# Patient Record
Sex: Female | Born: 1960 | Marital: Married | Smoking: Former smoker
Health system: Southern US, Community
[De-identification: ages and names within clinical notes are randomized; demographics above are authoritative.]

## PROBLEM LIST (undated history)

## (undated) DIAGNOSIS — E7800 Pure hypercholesterolemia, unspecified: Secondary | ICD-10-CM

## (undated) DIAGNOSIS — M069 Rheumatoid arthritis, unspecified: Secondary | ICD-10-CM

## (undated) HISTORY — PX: ABDOMINAL HYSTERECTOMY: SHX81

## (undated) HISTORY — DX: Rheumatoid arthritis, unspecified: M06.9

## (undated) HISTORY — DX: Pure hypercholesterolemia, unspecified: E78.00

## (undated) HISTORY — PX: CHOLECYSTECTOMY: SHX55

---

## 2013-05-21 HISTORY — PX: COLONOSCOPY: SHX174

## 2015-02-13 ENCOUNTER — Ambulatory Visit (INDEPENDENT_AMBULATORY_CARE_PROVIDER_SITE_OTHER): Payer: Private Health Insurance - Indemnity

## 2015-02-13 ENCOUNTER — Encounter: Payer: Self-pay | Admitting: Podiatry

## 2015-02-13 ENCOUNTER — Ambulatory Visit (INDEPENDENT_AMBULATORY_CARE_PROVIDER_SITE_OTHER): Payer: Private Health Insurance - Indemnity | Admitting: Podiatry

## 2015-02-13 DIAGNOSIS — M722 Plantar fascial fibromatosis: Secondary | ICD-10-CM

## 2015-02-13 MED ORDER — METHYLPREDNISOLONE 4 MG PO TBPK: ORAL_TABLET | ORAL | Status: DC

## 2015-02-13 MED ORDER — MELOXICAM 15 MG PO TABS: 15.0000 mg | ORAL_TABLET | Freq: Every day | ORAL | Status: DC

## 2015-02-13 NOTE — Progress Notes (Signed)
   Subjective:    Patient ID: Danielle Curtis, female    DOB: Jul 09, 1961, 54 y.o.   MRN: [id number]  HPI  PT STATED B/L BOTTOM OF THE FOOT BEEN HAVING PAIN FOR 1 YEAR. FEET ARE GETTING WORSE ESPECIALLY WHEN SITTING THEN START WALKING IT GET WORSE. TRIED INSERTS BUT NO HELP.  Review of Systems  Musculoskeletal: Positive for myalgias and joint swelling.  All other systems reviewed and are negative.      Objective:   Physical Exam: I have reviewed her past medical history medications allergies surgery social history and review of systems. Pulses are strongly palpable bilateral. Neurologic sensorium is intact per Semmes-Weinstein monofilament and deep tendon reflexes are intact bilaterally and brisk. Muscle strength +5 over 5 dorsiflexors plantar flexors and inverters and everters all intrinsic musculature of the foot appears to be intact. Orthopedic evaluation demonstrates all joints distal to the ankle level fall range of motion without crepitus she does have pain on palpation of the medial calcaneal tubercle bilateral. No pain on palpation of the medial and lateral calcaneus. Cutaneous evaluation demonstrates supple well-hydrated cutis no erythema edema cellulitis drainage or odor.        Assessment & Plan:  Assessment: Plantar fasciitis bilateral.  Plan: Discussed etiology pathology conservative versus surgical therapies. Discussed appropriate shoe gear stretching exercises ice therapy and shoe gear modifications. Injected the bilateral heels today with Kenalog and local and aesthetic. Started her on a Medrol Dosepak to be followed by meloxicam. Placed her in plantar fascial braces and a single night splint. I will follow up with her 1 month.

## 2015-02-13 NOTE — Patient Instructions (Signed)
Plantar Fasciitis (Heel Spur Syndrome) with Rehab The plantar fascia is a fibrous, ligament-like, soft-tissue structure that spans the bottom of the foot. Plantar fasciitis is a condition that causes pain in the foot due to inflammation of the tissue. SYMPTOMS   Pain and tenderness on the underneath side of the foot.  Pain that worsens with standing or walking. CAUSES  Plantar fasciitis is caused by irritation and injury to the plantar fascia on the underneath side of the foot. Common mechanisms of injury include:  Direct trauma to bottom of the foot.  Damage to a small nerve that runs under the foot where the main fascia attaches to the heel bone.  Stress placed on the plantar fascia due to bone spurs. RISK INCREASES WITH:   Activities that place stress on the plantar fascia (running, jumping, pivoting, or cutting).  Poor strength and flexibility.  Improperly fitted shoes.  Tight calf muscles.  Flat feet.  Failure to warm-up properly before activity.  Obesity. PREVENTION  Warm up and stretch properly before activity.  Allow for adequate recovery between workouts.  Maintain physical fitness:  Strength, flexibility, and endurance.  Cardiovascular fitness.  Maintain a health body weight.  Avoid stress on the plantar fascia.  Wear properly fitted shoes, including arch supports for individuals who have flat feet. PROGNOSIS  If treated properly, then the symptoms of plantar fasciitis usually resolve without surgery. However, occasionally surgery is necessary. RELATED COMPLICATIONS   Recurrent symptoms that may result in a chronic condition.  Problems of the lower back that are caused by compensating for the injury, such as limping.  Pain or weakness of the foot during push-off following surgery.  Chronic inflammation, scarring, and partial or complete fascia tear, occurring more often from repeated injections. TREATMENT  Treatment initially involves the use of  ice and medication to help reduce pain and inflammation. The use of strengthening and stretching exercises may help reduce pain with activity, especially stretches of the Achilles tendon. These exercises may be performed at home or with a therapist. Your caregiver may recommend that you use heel cups of arch supports to help reduce stress on the plantar fascia. Occasionally, corticosteroid injections are given to reduce inflammation. If symptoms persist for greater than 6 months despite non-surgical (conservative), then surgery may be recommended.  MEDICATION   If pain medication is necessary, then nonsteroidal anti-inflammatory medications, such as aspirin and ibuprofen, or other minor pain relievers, such as acetaminophen, are often recommended.  Do not take pain medication within 7 days before surgery.  Prescription pain relievers may be given if deemed necessary by your caregiver. Use only as directed and only as much as you need.  Corticosteroid injections may be given by your caregiver. These injections should be reserved for the most serious cases, because they may only be given a certain number of times. HEAT AND COLD  Cold treatment (icing) relieves pain and reduces inflammation. Cold treatment should be applied for 10 to 15 minutes every 2 to 3 hours for inflammation and pain and immediately after any activity that aggravates your symptoms. Use ice packs or massage the area with a piece of ice (ice massage).  Heat treatment may be used prior to performing the stretching and strengthening activities prescribed by your caregiver, physical therapist, or athletic trainer. Use a heat pack or soak the injury in warm water. SEEK IMMEDIATE MEDICAL CARE IF:  Treatment seems to offer no benefit, or the condition worsens.  Any medications produce adverse side effects. EXERCISES RANGE   OF MOTION (ROM) AND STRETCHING EXERCISES - Plantar Fasciitis (Heel Spur Syndrome) These exercises may help you  when beginning to rehabilitate your injury. Your symptoms may resolve with or without further involvement from your physician, physical therapist or athletic trainer. While completing these exercises, remember:   Restoring tissue flexibility helps normal motion to return to the joints. This allows healthier, less painful movement and activity.  An effective stretch should be held for at least 30 seconds.  A stretch should never be painful. You should only feel a gentle lengthening or release in the stretched tissue. RANGE OF MOTION - Toe Extension, Flexion  Sit with your right / left leg crossed over your opposite knee.  Grasp your toes and gently pull them back toward the top of your foot. You should feel a stretch on the bottom of your toes and/or foot.  Hold this stretch for __________ seconds.  Now, gently pull your toes toward the bottom of your foot. You should feel a stretch on the top of your toes and or foot.  Hold this stretch for __________ seconds. Repeat __________ times. Complete this stretch __________ times per day.  RANGE OF MOTION - Ankle Dorsiflexion, Active Assisted  Remove shoes and sit on a chair that is preferably not on a carpeted surface.  Place right / left foot under knee. Extend your opposite leg for support.  Keeping your heel down, slide your right / left foot back toward the chair until you feel a stretch at your ankle or calf. If you do not feel a stretch, slide your bottom forward to the edge of the chair, while still keeping your heel down.  Hold this stretch for __________ seconds. Repeat __________ times. Complete this stretch __________ times per day.  STRETCH - Gastroc, Standing  Place hands on wall.  Extend right / left leg, keeping the front knee somewhat bent.  Slightly point your toes inward on your back foot.  Keeping your right / left heel on the floor and your knee straight, shift your weight toward the wall, not allowing your back to  arch.  You should feel a gentle stretch in the right / left calf. Hold this position for __________ seconds. Repeat __________ times. Complete this stretch __________ times per day. STRETCH - Soleus, Standing  Place hands on wall.  Extend right / left leg, keeping the other knee somewhat bent.  Slightly point your toes inward on your back foot.  Keep your right / left heel on the floor, bend your back knee, and slightly shift your weight over the back leg so that you feel a gentle stretch deep in your back calf.  Hold this position for __________ seconds. Repeat __________ times. Complete this stretch __________ times per day. STRETCH - Gastrocsoleus, Standing  Note: This exercise can place a lot of stress on your foot and ankle. Please complete this exercise only if specifically instructed by your caregiver.   Place the ball of your right / left foot on a step, keeping your other foot firmly on the same step.  Hold on to the wall or a rail for balance.  Slowly lift your other foot, allowing your body weight to press your heel down over the edge of the step.  You should feel a stretch in your right / left calf.  Hold this position for __________ seconds.  Repeat this exercise with a slight bend in your right / left knee. Repeat __________ times. Complete this stretch __________ times per day.    STRENGTHENING EXERCISES - Plantar Fasciitis (Heel Spur Syndrome)  These exercises may help you when beginning to rehabilitate your injury. They may resolve your symptoms with or without further involvement from your physician, physical therapist or athletic trainer. While completing these exercises, remember:   Muscles can gain both the endurance and the strength needed for everyday activities through controlled exercises.  Complete these exercises as instructed by your physician, physical therapist or athletic trainer. Progress the resistance and repetitions only as guided. STRENGTH -  Towel Curls  Sit in a chair positioned on a non-carpeted surface.  Place your foot on a towel, keeping your heel on the floor.  Pull the towel toward your heel by only curling your toes. Keep your heel on the floor.  If instructed by your physician, physical therapist or athletic trainer, add ____________________ at the end of the towel. Repeat __________ times. Complete this exercise __________ times per day. STRENGTH - Ankle Inversion  Secure one end of a rubber exercise band/tubing to a fixed object (table, pole). Loop the other end around your foot just before your toes.  Place your fists between your knees. This will focus your strengthening at your ankle.  Slowly, pull your big toe up and in, making sure the band/tubing is positioned to resist the entire motion.  Hold this position for __________ seconds.  Have your muscles resist the band/tubing as it slowly pulls your foot back to the starting position. Repeat __________ times. Complete this exercises __________ times per day.  Document Released: 08/02/2005 Document Revised: 10/25/2011 Document Reviewed: 11/14/2008 Exit[employer] Patient Information 2015 [employer]. This information is not intended to replace advice given to you by your health care provider. Make sure you discuss any questions you have with your health care provider.  

## 2015-03-06 ENCOUNTER — Ambulatory Visit (INDEPENDENT_AMBULATORY_CARE_PROVIDER_SITE_OTHER): Payer: Managed Care, Other (non HMO) | Admitting: Podiatry

## 2015-03-06 ENCOUNTER — Encounter: Payer: Self-pay | Admitting: Podiatry

## 2015-03-06 VITALS — BP 119/79 | HR 94 | Resp 16

## 2015-03-06 DIAGNOSIS — M722 Plantar fascial fibromatosis: Secondary | ICD-10-CM | POA: Diagnosis not present

## 2015-03-06 DIAGNOSIS — L603 Nail dystrophy: Secondary | ICD-10-CM

## 2015-03-06 NOTE — Progress Notes (Signed)
She presents today for follow-up of her plantar fasciitis. She is also complaining of thick possibly dystrophic nails of the hallux bilaterally which are painful with shoe gear. She states that she has not had another pain in her heels since she left last visit. She states that she continues all of her conservative therapies. She is also complaining of a numb sensation to her hallux right.  Objective: Vital signs are stable she is alert and oriented 3 pulses are palpable bilateral. No pain on palpation bilateral plantar calcaneal tubercles. She does have thick dystrophic possibly mycotic nails to the hallux bilaterally left greater than right there is subungual debris and nail plate is friable and brittle. Possible tinea pedis bilateral. No obvious finding for the numbness in her hallux right. Plantar distal aspect.   Assessment: Well-healing plantar fasciitis. Possible nail dystrophy hallux bilateral rule out onychomycosis. Neuropraxia hallux right.  Plan: Samples of the nails from the left hallux today. I suggested that she continue all conservative therapies for one more month regarding her plantar fasciitis. This is consisting of the use of the night splint tennis shoes plantar fascial braces and anti-inflammatory's. I'll follow-up with her in 1 month or when her nail biopsies return.

## 2015-03-13 ENCOUNTER — Ambulatory Visit: Payer: Private Health Insurance - Indemnity | Admitting: Podiatry

## 2015-03-25 ENCOUNTER — Telehealth: Payer: Self-pay | Admitting: *Deleted

## 2015-03-25 NOTE — Telephone Encounter (Signed)
Left message instructing pt to make an appt to discuss the lab results with Dr. Milinda Pointer.  Toenail culture +for fungus.

## 2015-03-27 ENCOUNTER — Ambulatory Visit: Payer: Private Health Insurance - Indemnity | Admitting: Podiatry

## 2015-04-01 ENCOUNTER — Encounter: Payer: Self-pay | Admitting: Podiatry

## 2015-04-01 ENCOUNTER — Ambulatory Visit (INDEPENDENT_AMBULATORY_CARE_PROVIDER_SITE_OTHER): Payer: Managed Care, Other (non HMO) | Admitting: Podiatry

## 2015-04-01 VITALS — BP 123/74 | HR 80 | Resp 16

## 2015-04-01 DIAGNOSIS — L603 Nail dystrophy: Secondary | ICD-10-CM

## 2015-04-01 MED ORDER — ITRACONAZOLE 200 MG PO TABS: 1.0000 | ORAL_TABLET | Freq: Every day | ORAL | Status: DC

## 2015-04-01 MED ORDER — MELOXICAM 15 MG PO TABS: 15.0000 mg | ORAL_TABLET | Freq: Every day | ORAL | Status: DC

## 2015-04-01 NOTE — Progress Notes (Signed)
She presents today for follow-up of her onychomycosis. She states that this has not changed. No changes in her past medical history medications allergies.  Objective: 54 year old white female strong palpable pulses also stable alert and oriented 3. Pulses are strongly palpable neurologic sensorium is intact percent as was the monofilament. Deep tendon reflexes are intact bilateral muscle strength is 5 over 5 dorsiflexion plantar flexors and inverters everters all intrinsic musculature is intact. Orthopedic evaluation and x-rays all joints distal to the angle full range of motion without crepitation. Cutaneous evaluationsupple well-hydrated cutis nails are thick yellow dystrophic onychomycotic. Pathology report was positive for onychomycosis separate fungus.  Assessment: Saprophyticus onychomycosis.  Plan: At this point we discussed pros and cons of conservative versus surgical therapies she would also like to consider oral therapy. We started her on ONMEL and we requested a liver profile. Should this come back abnormal I will notify her immediately. I will follow-up with her in 1 month for another blood work. Should this one come back abnormal we will notify her immediately.

## 2015-04-01 NOTE — Patient Instructions (Signed)
Itraconazole capsules and tablets What is this medicine? ITRACONAZOLE (i tra KO na zole) is an antifungal medicine. It is used to treat certain kinds of fungal or yeast infections. This medicine may be used for other purposes; ask your health care provider or pharmacist if you have questions. COMMON BRAND NAME(S): ONMEL, Sporanox What should I tell my health care provider before I take this medicine? They need to know if you have any of these conditions: -heart disease, including angina or heart failure -history of irregular heartbeat -kidney disease or on dialysis -liver disease -lung disease -an unusual or allergic reaction to itraconazole, or other antifungal medicines, foods, dyes or preservatives -pregnant or trying to get pregnant -breast-feeding How should I use this medicine? Take this medicine by mouth with a full glass of water. Follow the directions on the prescription label. Take after eating a full meal. If you have a condition called achlorhydria, are taking H2-receptor antagonists or other gastric acid suppressors, you should take this medicine with a cola beverage. Take your doses at regular intervals. Do not take your medicine more often than directed. Do not stop taking except on your doctor's advice. Talk to your pediatrician regarding the use of this medicine in children. Special care may be needed. Overdosage: If you think you have taken too much of this medicine contact a poison control center or emergency room at once. NOTE: This medicine is only for you. Do not share this medicine with others. What if I miss a dose? If you miss a dose, take it as soon as you can. If it is almost time for your next dose, take only that dose. Do not take double or extra doses. What may interact with this medicine? Do not take this medicine with any of the following medications: -alfuzosin -cisapride -colchicine -ergot alkaloids like dihydroergotamine, ergonovine, ergotamine,  methylergonovine -methadone -nevirapine -pimozide -red yeast rice -sirolimus -some medicines for anxiety or sleep like alprazolam, clorazepate, flurazepam, midazolam, triazolam -some medicines for blood pressure like felodipine and nisoldipine -some medicines for cancer treatment like irinotecan -some medicines for cholesterol like atorvastatin, cerivastatin, lovastatin, simvastatin -some medicines for the heart like conivaptan, disopyramide, dofetilide, dronedarone, eplerenone, quinidine, ranolazine -some medicines for psychotic disturbances like lurasidone This medicine may also interact with the following medications: -alfentanil -antacids -antiviral medicines for HIV or AIDS -budesonide -buspirone -cilostazol -cyclosporine -digoxin -eletriptan -erythromycin -fentanyl -fluticasone -halofantrine -medicines for blood pressure like amlodipine and nifedipine -medicines for stomach problems like cimetidine, famotidine, omeprazole, lansoprazole -medicines for tuberculosis like isoniazid, INH, rifabutin, rifampin, rifapentine -medicines that treat or prevent blood clots like warfarin -methylprednisolone -other medicines for fungal infections -phenytoin, fosphenytoin -some medicines for diabetes -tacrolimus -trimetrexate This list may not describe all possible interactions. Give your health care provider a list of all the medicines, herbs, non-prescription drugs, or dietary supplements you use. Also tell them if you smoke, drink alcohol, or use illegal drugs. Some items may interact with your medicine. What should I watch for while using this medicine? Visit your doctor or health care professional for check ups. Tell your doctor or healthcare professional if your symptoms do not start to get better or if they get worse. Some fungal infections can take many weeks or months of treatment to cure. Avoid medicines for your stomach like antacids, anticholinergics, and acid blockers for at  least 2 hours after taking this medicine. You may get dizzy or blurred/double vision when taking this medicine. Do not drive, use machinery, or do anything that may be dangerous  until you know how the medicine affects you. Do not stand or sit up quickly, especially if you are an older patient. What side effects may I notice from receiving this medicine? Side effects that you should report to your doctor or health care professional as soon as possible: -allergic reactions like skin rash, itching or hives, swelling of the face, lips, or tongue -breathing problems -changes in hearing -cough up mucus -dark urine -fast, irregular heartbeat -general ill feeling or flu-like symptoms -light-colored stools -loss of appetite -nausea, vomiting -pain, tingling, numbness in the hands or feet -redness, blistering, peeling or loosening of the skin, including inside the mouth -right upper belly pain -sudden weight gain -swelling in feet, ankles, or legs -unusually weak or tired -yellowing of the eyes or skin Side effects that usually do not require medical attention (report to your doctor or health care professional if they continue or are bothersome): -blurred/double vision -diarrhea -dizziness -headache -stomach upset or bloating This list may not describe all possible side effects. Call your doctor for medical advice about side effects. You may report side effects to FDA at 1-800-FDA-1088. Where should I keep my medicine? Keep out of the reach of children. Store at room temperature between 15 and 25 degrees C (59 and 77 degrees F). Protect from light and moisture. Throw away any unused medicine after the expiration date. NOTE: This sheet is a summary. It may not cover all possible information. If you have questions about this medicine, talk to your doctor, pharmacist, or health care provider.  2015, [employer]vier/Gold Standard. (2013-02-12 17:02:46)

## 2015-04-02 LAB — CBC WITH DIFFERENTIAL/PLATELET
BASOS ABS: 0 10*3/uL (ref 0.0–0.1)
Basophils Relative: 0 % (ref 0–1)
EOS ABS: 0.1 10*3/uL (ref 0.0–0.7)
Eosinophils Relative: 1 % (ref 0–5)
HCT: 40.8 % (ref 36.0–46.0)
HEMOGLOBIN: 13.8 g/dL (ref 12.0–15.0)
LYMPHS ABS: 2.6 10*3/uL (ref 0.7–4.0)
LYMPHS PCT: 33 % (ref 12–46)
MCH: 30.1 pg (ref 26.0–34.0)
MCHC: 33.8 g/dL (ref 30.0–36.0)
MCV: 89.1 fL (ref 78.0–100.0)
MONOS PCT: 6 % (ref 3–12)
MPV: 9.3 fL (ref 8.6–12.4)
Monocytes Absolute: 0.5 10*3/uL (ref 0.1–1.0)
NEUTROS ABS: 4.7 10*3/uL (ref 1.7–7.7)
NEUTROS PCT: 60 % (ref 43–77)
PLATELETS: 312 10*3/uL (ref 150–400)
RBC: 4.58 MIL/uL (ref 3.87–5.11)
RDW: 13.9 % (ref 11.5–15.5)
WBC: 7.8 10*3/uL (ref 4.0–10.5)

## 2015-04-02 LAB — HEPATIC FUNCTION PANEL
ALBUMIN: 3.9 g/dL (ref 3.6–5.1)
ALK PHOS: 55 U/L (ref 33–130)
ALT: 17 U/L (ref 6–29)
AST: 16 U/L (ref 10–35)
BILIRUBIN TOTAL: 0.5 mg/dL (ref 0.2–1.2)
Bilirubin, Direct: 0.1 mg/dL (ref <=0.2)
Indirect Bilirubin: 0.4 mg/dL (ref 0.2–1.2)
Total Protein: 7 g/dL (ref 6.1–8.1)

## 2015-04-03 ENCOUNTER — Telehealth: Payer: Self-pay | Admitting: *Deleted

## 2015-04-03 NOTE — Telephone Encounter (Addendum)
-----   Message from Rip Harbour, [facility] sent at 04/03/2015  3:19 PM EDT -----   ----- Message -----    From: Garrel Ridgel, DPM    Sent: 04/03/2015   7:16 AM      To: Avanell Shackleton Prevette, PMAC  Blood work looks good and may continue medication.  I informed pt of Dr. Stephenie Acres recommendations.  Pt asked multiple questions concerning the reason the Onmel was sent to [facility] and I informed her the [employer] worked with the drug company to get the medication approved.  Pt asked if it would be okay to hold off on the Onmel, because she was going on a multiple month diet that she would not be able to take pills on, and I said I would inform Dr. Milinda Pointer.

## 2015-04-11 ENCOUNTER — Telehealth: Payer: Self-pay | Admitting: Podiatry

## 2015-04-11 MED ORDER — ITRACONAZOLE 200 MG PO TABS: 1.0000 | ORAL_TABLET | Freq: Every day | ORAL | Status: DC

## 2015-04-11 NOTE — Telephone Encounter (Signed)
Pt's original order for the Onmel was put in as generic Itraconazole.  Reordered as Onmel and sent to [facility] to be processed, and I requested a confirmation.  I informed the pt.

## 2015-04-11 NOTE — Telephone Encounter (Signed)
Pt called and was seen on 8.17 and has not received it yet

## 2015-04-29 ENCOUNTER — Ambulatory Visit: Payer: Managed Care, Other (non HMO) | Admitting: Podiatry

## 2018-08-30 DIAGNOSIS — Z1231 Encounter for screening mammogram for malignant neoplasm of breast: Secondary | ICD-10-CM | POA: Diagnosis not present

## 2018-09-22 DIAGNOSIS — M2550 Pain in unspecified joint: Secondary | ICD-10-CM | POA: Diagnosis not present

## 2018-09-22 DIAGNOSIS — F321 Major depressive disorder, single episode, moderate: Secondary | ICD-10-CM | POA: Diagnosis not present

## 2018-09-22 DIAGNOSIS — M0579 Rheumatoid arthritis with rheumatoid factor of multiple sites without organ or systems involvement: Secondary | ICD-10-CM | POA: Diagnosis not present

## 2018-09-22 DIAGNOSIS — H209 Unspecified iridocyclitis: Secondary | ICD-10-CM | POA: Diagnosis not present

## 2018-09-26 ENCOUNTER — Telehealth: Payer: Self-pay | Admitting: Gastroenterology

## 2018-09-26 NOTE — Telephone Encounter (Signed)
Pt is wanting to be seen by [facility] last colon 05/2013 [place]. PT records were placed on DOD desk (09/25/2018) Dr.Cirigliano

## 2018-10-05 NOTE — Telephone Encounter (Signed)
Dr.Cirigliano reviewed records and noted "colonoscopy 05/2013 diverticulosis on in sigmoid colon otherwise normal okay to repeat 10 years." Patient has been notified of this information and is scheduled for an office visit to discuss trouble swallowing.

## 2018-10-17 DIAGNOSIS — Z6830 Body mass index (BMI) 30.0-30.9, adult: Secondary | ICD-10-CM | POA: Diagnosis not present

## 2018-10-17 DIAGNOSIS — R05 Cough: Secondary | ICD-10-CM | POA: Diagnosis not present

## 2018-10-17 DIAGNOSIS — J111 Influenza due to unidentified influenza virus with other respiratory manifestations: Secondary | ICD-10-CM | POA: Diagnosis not present

## 2018-10-17 DIAGNOSIS — R5381 Other malaise: Secondary | ICD-10-CM | POA: Diagnosis not present

## 2018-10-19 ENCOUNTER — Encounter: Payer: Self-pay | Admitting: Gastroenterology

## 2018-10-19 ENCOUNTER — Encounter (INDEPENDENT_AMBULATORY_CARE_PROVIDER_SITE_OTHER): Payer: Self-pay

## 2018-10-19 ENCOUNTER — Ambulatory Visit: Payer: BLUE CROSS/BLUE SHIELD | Admitting: Gastroenterology

## 2018-10-19 VITALS — BP 110/72 | HR 90 | Ht 65.0 in | Wt 177.2 lb

## 2018-10-19 DIAGNOSIS — R1110 Vomiting, unspecified: Secondary | ICD-10-CM

## 2018-10-19 DIAGNOSIS — R1310 Dysphagia, unspecified: Secondary | ICD-10-CM

## 2018-10-19 NOTE — Progress Notes (Signed)
Chief Complaint:  Dysphagia   Referring Provider:     Self   HPI:    Danielle Curtis is a 58 y.o. female presenting to the [facility] for evaluation of intermittent dysphagia. Sxs have been present for nearly 1 year.  Points to anterior neck. Worse with meat. Occurs spordically, with last episode a few weeks ago. Can clear with time and fluids. No hx of food impactions or ER evaluations. Has learned to chew thoroughly and drink plenty of fluids with meals. Always the last to finish meals for many years. Has a hx of reflux many years ago (belching, regurgitation) and was treated by an "herbalist" with resolution of index symptoms. No previous EGD.  Does not take any acid suppression therapy or antacids.  Patient otherwise denies diarrhea, constipation, hematochezia, melena, night sweats, fever, chills, weight loss,   Endoscopic Hx: - Colonoscopy (05/2013): Normal per patient.  No records in EMR.  Past Medical History:  Diagnosis Date  . RA (rheumatoid arthritis) ([place])      Past Surgical History:  Procedure Laterality Date  . ABDOMINAL HYSTERECTOMY     around 1995  . CHOLECYSTECTOMY     around 2010  . COLONOSCOPY  05/21/2013   Dr Orlena Sheldon   Family History  Problem Relation Age of Onset  . Prostate cancer Father   . Breast cancer Sister   . Ovarian cancer Sister        older sister  . Colon cancer Neg Hx   . Esophageal cancer Neg Hx    Social History   Tobacco Use  . Smoking status: Former Research scientist (life sciences)  . Smokeless tobacco: Never Used  Substance Use Topics  . Alcohol use: No  . Drug use: No   Current Outpatient Medications  Medication Sig Dispense Refill  . AMBULATORY NON FORMULARY MEDICATION 2 tablets daily. Dr Schulze's intestinal formula #1    . Etanercept (ENBREL [place]) Inject into the skin once a week.    Marland Kitchen ibuprofen (ADVIL,MOTRIN) 600 MG tablet Take 600 mg by mouth as needed.    Marland Kitchen MAGNESIUM CITRATE PO Take 1 tablet by mouth daily.     No  current facility-administered medications for this visit.    Allergies  Allergen Reactions  . Pregabalin Other (See Comments) and Swelling    MOBILITY PROBLEMS  . Gabapentin Other (See Comments)    Experienced side effects of the medication  . Codeine      Review of Systems: All systems reviewed and negative except where noted in HPI.     Physical Exam:    Wt Readings from Last 3 Encounters:  10/19/18 177 lb 4 oz (80.4 kg)    BP 110/72   Pulse 90   Ht 5\' 5"  (1.651 m)   Wt 177 lb 4 oz (80.4 kg)   BMI 29.50 kg/m  Constitutional:  Pleasant, in no acute distress. Psychiatric: Normal mood and affect. Behavior is normal. EENT: Pupils normal.  Conjunctivae are normal. No scleral icterus. Neck supple. No cervical LAD. Cardiovascular: Normal rate, regular rhythm. No edema Pulmonary/chest: Effort normal and breath sounds normal. No wheezing, rales or rhonchi. Abdominal: Soft, nondistended, nontender. Bowel sounds active throughout. There are no masses palpable. No hepatomegaly. Neurological: Alert and oriented to person place and time. Skin: Skin is warm and dry. No rashes noted.   ASSESSMENT AND PLAN;   Danielle Curtis is a 58 y.o. female presenting with:  1) Dysphagia: Discussed the  DDX for progressive solid food dysphagia and will evaluate further as below:  - EGD with esophageal dilation and/or esophageal biopsies as indicated -Advised patient to cut food into small pieces, eat small bites, chew food thoroughly and with plenty of liquids to avoid food impaction.  2) History of reflux: History of reflux years ago with index symptoms of belching and regurgitation.  Resolution after was seen by a " herbalist".  -Evaluate for objective evidence of ongoing silent reflux at time of EGD as above, to include erosive esophagitis along with evidence of LES laxity, hiatal hernia, etc.  The indications, risks, and benefits of EGD were explained to the patient in detail. Risks  include but are not limited to bleeding, perforation, adverse reaction to medications, and cardiopulmonary compromise. Sequelae include but are not limited to the possibility of surgery, hositalization, and mortality. The patient verbalized understanding and wished to proceed. All questions answered, referred to scheduler. Further recommendations pending results of the exam.      Dominic Pea Galileo Colello, DO, FACG  10/19/2018, 2:18 PM   No ref. provider found

## 2018-10-19 NOTE — Patient Instructions (Signed)
If you are age 56 or older, your body mass index should be between 23-30. Your Body mass index is 29.5 kg/m. If this is out of the aforementioned range listed, please consider follow up with your Primary Care Provider.  If you are age 49 or younger, your body mass index should be between 19-25. Your Body mass index is 29.5 kg/m. If this is out of the aformentioned range listed, please consider follow up with your Primary Care Provider.   You have been scheduled for an endoscopy. Please follow written instructions given to you at your visit today. If you use inhalers (even only as needed), please bring them with you on the day of your procedure. Your physician has requested that you go to www.startemmi.com and enter the access code given to you at your visit today. This web site gives a general overview about your procedure. However, you should still follow specific instructions given to you by our office regarding your preparation for the procedure.  It was a pleasure to see you today!  Vito Cirigliano, D.O.

## 2018-10-30 ENCOUNTER — Telehealth: Payer: Self-pay | Admitting: Gastroenterology

## 2018-10-30 DIAGNOSIS — R05 Cough: Secondary | ICD-10-CM | POA: Diagnosis not present

## 2018-10-30 DIAGNOSIS — Z6830 Body mass index (BMI) 30.0-30.9, adult: Secondary | ICD-10-CM | POA: Diagnosis not present

## 2018-10-30 DIAGNOSIS — J069 Acute upper respiratory infection, unspecified: Secondary | ICD-10-CM | POA: Diagnosis not present

## 2018-10-30 NOTE — Telephone Encounter (Signed)
Pt is scheduled for egd tomorrow, she states that she got up this morning with severe cough, which she thinks it is her sinus. She wants to know if she needs to r/s. pls call her.

## 2018-10-30 NOTE — Telephone Encounter (Signed)
Pt called stating she was sick with a severe cough and laryngitis. Canceled procedure for tomorrow and pt states she will call back to reschedule when she feels better.

## 2018-10-31 ENCOUNTER — Encounter: Payer: BLUE CROSS/BLUE SHIELD | Admitting: Gastroenterology

## 2019-03-23 DIAGNOSIS — H209 Unspecified iridocyclitis: Secondary | ICD-10-CM | POA: Diagnosis not present

## 2019-03-23 DIAGNOSIS — Z79899 Other long term (current) drug therapy: Secondary | ICD-10-CM | POA: Diagnosis not present

## 2019-03-23 DIAGNOSIS — M0579 Rheumatoid arthritis with rheumatoid factor of multiple sites without organ or systems involvement: Secondary | ICD-10-CM | POA: Diagnosis not present

## 2019-03-23 DIAGNOSIS — M2550 Pain in unspecified joint: Secondary | ICD-10-CM | POA: Diagnosis not present

## 2019-07-11 DIAGNOSIS — J029 Acute pharyngitis, unspecified: Secondary | ICD-10-CM | POA: Diagnosis not present

## 2019-07-11 DIAGNOSIS — Z79899 Other long term (current) drug therapy: Secondary | ICD-10-CM | POA: Diagnosis not present

## 2019-07-11 DIAGNOSIS — E785 Hyperlipidemia, unspecified: Secondary | ICD-10-CM | POA: Diagnosis not present

## 2019-07-11 DIAGNOSIS — Z6828 Body mass index (BMI) 28.0-28.9, adult: Secondary | ICD-10-CM | POA: Diagnosis not present

## 2019-09-24 DIAGNOSIS — Z79899 Other long term (current) drug therapy: Secondary | ICD-10-CM | POA: Diagnosis not present

## 2019-09-24 DIAGNOSIS — H209 Unspecified iridocyclitis: Secondary | ICD-10-CM | POA: Diagnosis not present

## 2019-09-24 DIAGNOSIS — M2550 Pain in unspecified joint: Secondary | ICD-10-CM | POA: Diagnosis not present

## 2019-09-24 DIAGNOSIS — M0579 Rheumatoid arthritis with rheumatoid factor of multiple sites without organ or systems involvement: Secondary | ICD-10-CM | POA: Diagnosis not present

## 2019-12-17 ENCOUNTER — Encounter: Payer: Self-pay | Admitting: Nurse Practitioner

## 2019-12-18 DIAGNOSIS — R1013 Epigastric pain: Secondary | ICD-10-CM | POA: Diagnosis not present

## 2019-12-18 DIAGNOSIS — M2550 Pain in unspecified joint: Secondary | ICD-10-CM | POA: Diagnosis not present

## 2019-12-18 DIAGNOSIS — M0579 Rheumatoid arthritis with rheumatoid factor of multiple sites without organ or systems involvement: Secondary | ICD-10-CM | POA: Diagnosis not present

## 2019-12-18 DIAGNOSIS — H209 Unspecified iridocyclitis: Secondary | ICD-10-CM | POA: Diagnosis not present

## 2020-01-06 NOTE — Progress Notes (Signed)
01/06/2020 Danielle Curtis [id number] [birth year]   Chief Complaint: Dysphagia  History of Present Illness: Danielle Curtis is a 59 year old female with a past medial history of rheumatoid arthritis. Past cholecystectomy and total hysterectomy. She was last seen in our office by Dr. Bryan Lemma on 10/19/2018 for further evaluation for dysphagia. An EGD was recommended, however, due to the COVID-19 pandemic she did not schedule the EGD.  She presents today with persistent dysphagia. She elects to proceed with an EGD at this time.  She reports having food such as chicken, apple and potato chips get stuck to the upper esophagus which last for less than 1 minute.  She sits up straight with her chin up and she tries to relax and the food passes down.  Her dysphagia symptoms occur once every 2 weeks.  She also notices pain inside her esophagus as food passes down before the food gets stuck. She sometimes takes small sips of water which also facilitates passing the stuck food.  She has infrequent heartburn.  She has occasional right mid abdominal pain at the waistline which comes and goes for the past year. No change after before or after defecation. Her right mid abdominal pain occurs once every few weeks and lasts for about an hour and goes away after she takes Ibuprofen or Tylenol.  She is taking Ibuprofen 600 mg 1 tab twice daily, sometimes 3 times daily for rheumatoid arthritic pain x 4 to 5 years.  No other NSAID use.  She is passing a normal formed brown bowel movement 2-3 times daily as long as she takes Dr. Lennice Sites' herbal supplement 2 tabs at bed time. She has an occasional loose stool.  No melena.  She reports having bright red rectal bleeding which occurs twice monthly for the past 2 to 3 years.  She describes seeing a large amount of bright red blood in the toilet bowl which turns the water red which typically occurs that she passes 2-3 bowel movements in one day. No associated anal or rectal  pain. No family history of colorectal cancer.  She underwent a colonoscopy by Dr. Melina Copa on 05/21/2013 which identified diverticulosis to the sigmoid colon.  No polyps.  The bowel preparation was good.   A recall colonoscopy was recommended in 10 years.  She reported awakening mid procedure during her colonoscopy, Versed and Fentanyl were utilized during this procedure.  She intentionally lost 50 to 60 pounds by following weight watchers over the past 3 years, however, over the past year she has gained back 15 to 20 pounds.  She drinks 1-2 diet sodas daily.  She drinks 1 to 2 cups of coffee daily.  No alcohol use.  No other complaints today.   Current Outpatient Medications on File Prior to Visit  Medication Sig Dispense Refill  . AMBULATORY NON FORMULARY MEDICATION 2 tablets daily. Dr Schulze's intestinal formula #1    . Etanercept (ENBREL [place]) Inject into the skin once a week.    Marland Kitchen ibuprofen (ADVIL,MOTRIN) 600 MG tablet Take 600 mg by mouth as needed.    Marland Kitchen MAGNESIUM CITRATE PO Take 1 tablet by mouth daily.     No current facility-administered medications on file prior to visit.    Allergies  Allergen Reactions  . Pregabalin Other (See Comments) and Swelling    MOBILITY PROBLEMS  . Gabapentin Other (See Comments)    Experienced side effects of the medication  . Codeine     Current Medications, Allergies, Past Medical History,  Past Surgical History, Family History and Social History were reviewed in [employer] record.   Physical Exam: BP 122/79   Pulse 62   Ht 5\' 5"  (1.651 m)   Wt 186 lb (84.4 kg)   BMI 30.95 kg/m   General: Well developed 59 year old female in no acute distress. Head: Normocephalic and atraumatic. Eyes: No scleral icterus. Conjunctiva pink . Ears: Normal auditory acuity. Mouth: Dentition intact. No ulcers or lesions.  Lungs: Clear throughout to auscultation. Heart: Regular rate and rhythm, no murmur. Abdomen: Soft, nontender and  nondistended. No masses or hepatomegaly. Normal bowel sounds x 4 quadrants.  Rectal: Deferred. Musculoskeletal: Symmetrical with no gross deformities. Extremities: No edema. Neurological: Alert oriented x 4. No focal deficits.  Psychological: Alert and cooperative. Normal mood and affect  Assessment and Recommendations:  63. 59 year old female with dysphagia.  Chronic NSAID use. -EGD benefits and risks discussed including risk with sedation, risk of bleeding, perforation and infection  -Omeprazole 20 mg 1 p.o. daily -Call our office if her dysphagia worsens  2. Rectal bleeding -Colonoscopy to be done at the time of her EGD. Colonoscopy benefits and risks discussed including risk with sedation, risk of bleeding, perforation and infection  -CBC, CMP -Benefiber daily -Patient to call our office if her rectal bleeding worsens  3.  Rheumatoid arthritis on Enbrel and chronic NSAID use  Further follow-up to be determined after the above evaluation completed

## 2020-01-07 ENCOUNTER — Ambulatory Visit (INDEPENDENT_AMBULATORY_CARE_PROVIDER_SITE_OTHER): Payer: BC Managed Care – PPO | Admitting: Nurse Practitioner

## 2020-01-07 ENCOUNTER — Encounter: Payer: Self-pay | Admitting: Nurse Practitioner

## 2020-01-07 ENCOUNTER — Other Ambulatory Visit (INDEPENDENT_AMBULATORY_CARE_PROVIDER_SITE_OTHER): Payer: BC Managed Care – PPO

## 2020-01-07 VITALS — BP 122/79 | HR 62 | Ht 65.0 in | Wt 186.0 lb

## 2020-01-07 DIAGNOSIS — R1310 Dysphagia, unspecified: Secondary | ICD-10-CM | POA: Diagnosis not present

## 2020-01-07 DIAGNOSIS — K625 Hemorrhage of anus and rectum: Secondary | ICD-10-CM | POA: Diagnosis not present

## 2020-01-07 DIAGNOSIS — R1319 Other dysphagia: Secondary | ICD-10-CM

## 2020-01-07 LAB — CBC
HCT: 38.8 % (ref 36.0–46.0)
Hemoglobin: 13.3 g/dL (ref 12.0–15.0)
MCHC: 34.2 g/dL (ref 30.0–36.0)
MCV: 90.2 fl (ref 78.0–100.0)
Platelets: 264 10*3/uL (ref 150.0–400.0)
RBC: 4.31 Mil/uL (ref 3.87–5.11)
RDW: 12.4 % (ref 11.5–15.5)
WBC: 6 10*3/uL (ref 4.0–10.5)

## 2020-01-07 LAB — COMPREHENSIVE METABOLIC PANEL
ALT: 16 U/L (ref 0–35)
AST: 17 U/L (ref 0–37)
Albumin: 4.6 g/dL (ref 3.5–5.2)
Alkaline Phosphatase: 54 U/L (ref 39–117)
BUN: 19 mg/dL (ref 6–23)
CO2: 25 mEq/L (ref 19–32)
Calcium: 9.5 mg/dL (ref 8.4–10.5)
Chloride: 104 mEq/L (ref 96–112)
Creatinine, Ser: 0.82 mg/dL (ref 0.40–1.20)
GFR: 71.85 mL/min (ref >60.00)
Glucose, Bld: 98 mg/dL (ref 70–99)
Potassium: 4.1 mEq/L (ref 3.5–5.1)
Sodium: 136 mEq/L (ref 135–145)
Total Bilirubin: 0.6 mg/dL (ref 0.2–1.2)
Total Protein: 7.7 g/dL (ref 6.0–8.3)

## 2020-01-07 MED ORDER — OMEPRAZOLE 20 MG PO CPDR
20.0000 mg | DELAYED_RELEASE_CAPSULE | Freq: Every day | ORAL | 1 refills | Status: DC
Start: 2020-01-07 — End: 2020-12-25

## 2020-01-07 MED ORDER — CLENPIQ 10-3.5-12 MG-GM -GM/160ML PO SOLN
1.0000 | ORAL | 0 refills | Status: DC
Start: 2020-01-07 — End: 2020-01-15

## 2020-01-07 NOTE — Patient Instructions (Addendum)
If you are age 57 or older, your body mass index should be between 23-30. Your Body mass index is 30.95 kg/m. If this is out of the aforementioned range listed, please consider follow up with your Primary Care Provider.  If you are age 73 or younger, your body mass index should be between 19-25. Your Body mass index is 30.95 kg/m. If this is out of the aformentioned range listed, please consider follow up with your Primary Care Provider.   You have been scheduled for an endoscopy and colonoscopy. Please follow the written instructions given to you at your visit today. Please pick up your prep supplies at the pharmacy within the next 1-3 days. If you use inhalers (even only as needed), please bring them with you on the day of your procedure.  We have sent the following medications to your pharmacy for you to pick up at your convenience:  Omeprazole 20mg  1 tablet daily Clenpiq for your Colonoscopy prep  Your provider has requested that you go to the basement level for lab work before leaving today. Press "B" on the elevator. The lab is located at the first door on the left as you exit the elevator.  Avoid constipation  Call our office should your symptoms worsen.  Due to recent changes in healthcare laws, you may see the results of your imaging and laboratory studies on MyChart before your provider has had a chance to review them.  We understand that in some cases there may be results that are confusing or concerning to you. Not all laboratory results come back in the same time frame and the provider may be waiting for multiple results in order to interpret others.  Please give [place] 48 hours in order for your provider to thoroughly review all the results before contacting the office for clarification of your results.   Thank you for choosing [facility] Noralyn Pick, CRNP

## 2020-01-15 ENCOUNTER — Other Ambulatory Visit: Payer: Self-pay

## 2020-01-15 ENCOUNTER — Ambulatory Visit (AMBULATORY_SURGERY_CENTER): Payer: BC Managed Care – PPO | Admitting: Gastroenterology

## 2020-01-15 ENCOUNTER — Encounter: Payer: Self-pay | Admitting: Gastroenterology

## 2020-01-15 VITALS — BP 114/73 | HR 75 | Temp 97.7°F | Resp 17 | Ht 65.0 in | Wt 186.0 lb

## 2020-01-15 DIAGNOSIS — K625 Hemorrhage of anus and rectum: Secondary | ICD-10-CM

## 2020-01-15 DIAGNOSIS — R1310 Dysphagia, unspecified: Secondary | ICD-10-CM | POA: Diagnosis not present

## 2020-01-15 DIAGNOSIS — K6389 Other specified diseases of intestine: Secondary | ICD-10-CM

## 2020-01-15 DIAGNOSIS — K5730 Diverticulosis of large intestine without perforation or abscess without bleeding: Secondary | ICD-10-CM

## 2020-01-15 DIAGNOSIS — K635 Polyp of colon: Secondary | ICD-10-CM

## 2020-01-15 DIAGNOSIS — D125 Benign neoplasm of sigmoid colon: Secondary | ICD-10-CM

## 2020-01-15 DIAGNOSIS — K2081 Other esophagitis with bleeding: Secondary | ICD-10-CM

## 2020-01-15 DIAGNOSIS — K643 Fourth degree hemorrhoids: Secondary | ICD-10-CM

## 2020-01-15 DIAGNOSIS — K2970 Gastritis, unspecified, without bleeding: Secondary | ICD-10-CM

## 2020-01-15 DIAGNOSIS — K2211 Ulcer of esophagus with bleeding: Secondary | ICD-10-CM | POA: Diagnosis not present

## 2020-01-15 DIAGNOSIS — D124 Benign neoplasm of descending colon: Secondary | ICD-10-CM

## 2020-01-15 DIAGNOSIS — K222 Esophageal obstruction: Secondary | ICD-10-CM | POA: Diagnosis not present

## 2020-01-15 DIAGNOSIS — K2951 Unspecified chronic gastritis with bleeding: Secondary | ICD-10-CM | POA: Diagnosis not present

## 2020-01-15 DIAGNOSIS — Z1211 Encounter for screening for malignant neoplasm of colon: Secondary | ICD-10-CM | POA: Diagnosis not present

## 2020-01-15 MED ORDER — SODIUM CHLORIDE 0.9 % IV SOLN: 500.0000 mL | Freq: Once | INTRAVENOUS | Status: DC

## 2020-01-15 NOTE — Progress Notes (Signed)
A and O x3. Report to RN. Tolerated MAC anesthesia well.Teeth unchanged after procedure.

## 2020-01-15 NOTE — Progress Notes (Signed)
Agree with the assessment and plan as outlined by Colleen Kennedy-Smith, NP.   Alitza Cowman, DO, FACG  Gastroenterology   

## 2020-01-15 NOTE — Patient Instructions (Signed)
Discharge instructions given. Handouts on polyps,diverticulosis,hemorrhoids,gastritis and a dilatation diet. Resume previous medications. YOU HAD AN ENDOSCOPIC PROCEDURE TODAY AT [facility]:   Refer to the procedure report that was given to you for any specific questions about what was found during the examination.  If the procedure report does not answer your questions, please call your gastroenterologist to clarify.  If you requested that your care partner not be given the details of your procedure findings, then the procedure report has been included in a sealed envelope for you to review at your convenience later.  YOU SHOULD EXPECT: Some feelings of bloating in the abdomen. Passage of more gas than usual.  Walking can help get rid of the air that was put into your GI tract during the procedure and reduce the bloating. If you had a lower endoscopy (such as a colonoscopy or flexible sigmoidoscopy) you may notice spotting of blood in your stool or on the toilet paper. If you underwent a bowel prep for your procedure, you may not have a normal bowel movement for a few days.  Please Note:  You might notice some irritation and congestion in your nose or some drainage.  This is from the oxygen used during your procedure.  There is no need for concern and it should clear up in a day or so.  SYMPTOMS TO REPORT IMMEDIATELY:   Following lower endoscopy (colonoscopy or flexible sigmoidoscopy):  Excessive amounts of blood in the stool  Significant tenderness or worsening of abdominal pains  Swelling of the abdomen that is new, acute  Fever of 100F or higher   Following upper endoscopy (EGD)  Vomiting of blood or coffee ground material  New chest pain or pain under the shoulder blades  Painful or persistently difficult swallowing  New shortness of breath  Fever of 100F or higher  Black, tarry-looking stools  For urgent or emergent issues, a gastroenterologist can be reached at  any hour by calling [phone]. Do not use MyChart messaging for urgent concerns.    DIET:  We do recommend a small meal at first, but then you may proceed to your regular diet.  Drink plenty of fluids but you should avoid alcoholic beverages for 24 hours.  ACTIVITY:  You should plan to take it easy for the rest of today and you should NOT DRIVE or use heavy machinery until tomorrow (because of the sedation medicines used during the test).    FOLLOW UP: Our staff will call the number listed on your records 48-72 hours following your procedure to check on you and address any questions or concerns that you may have regarding the information given to you following your procedure. If we do not reach you, we will leave a message.  We will attempt to reach you two times.  During this call, we will ask if you have developed any symptoms of COVID 19. If you develop any symptoms (ie: fever, flu-like symptoms, shortness of breath, cough etc.) before then, please call [phone].  If you test positive for Covid 19 in the 2 weeks post procedure, please call and report this information to [place].    If any biopsies were taken you will be contacted by phone or by letter within the next 1-3 weeks.  Please call us at [phone] if you have not heard about the biopsies in 3 weeks.    SIGNATURES/CONFIDENTIALITY: You and/or your care partner have signed paperwork which will be entered into your electronic medical record.  These signatures attest to the fact that that the information above on your After Visit Summary has been reviewed and is understood.  Full responsibility of the confidentiality of this discharge information lies with you and/or your care-partner.

## 2020-01-15 NOTE — Op Note (Signed)
[facility] Patient Name: Danielle Curtis Procedure Date: 01/15/2020 2:52 PM MRN: [id number] Endoscopist: Gerrit Heck , MD Age: 59 Referring MD:  Date of Birth: November 29, 1960 Gender: Female Account #: [id number] Procedure:                Colonoscopy Indications:              Hematochezia Medicines:                Monitored Anesthesia Care Procedure:                Pre-Anesthesia Assessment:                           - Prior to the procedure, a History and Physical                            was performed, and patient medications and                            allergies were reviewed. The patient's tolerance of                            previous anesthesia was also reviewed. The risks                            and benefits of the procedure and the sedation                            options and risks were discussed with the patient.                            All questions were answered, and informed consent                            was obtained. Prior Anticoagulants: The patient has                            taken no previous anticoagulant or antiplatelet                            agents. ASA Grade Assessment: II - A patient with                            mild systemic disease. After reviewing the risks                            and benefits, the patient was deemed in                            satisfactory condition to undergo the procedure.                           After obtaining informed consent, the colonoscope  was passed under direct vision. Throughout the                            procedure, the patient's blood pressure, pulse, and                            oxygen saturations were monitored continuously. The                            Colonoscope was introduced through the anus and                            advanced to the the cecum, identified by                            appendiceal orifice and ileocecal valve. The                    colonoscopy was performed without difficulty. The                            patient tolerated the procedure well. The quality                            of the bowel preparation was good. The ileocecal                            valve, appendiceal orifice, and rectum were                            photographed. Scope In: 3:21:38 PM Scope Out: 3:35:47 PM Scope Withdrawal Time: 0 hours 11 minutes 43 seconds  Total Procedure Duration: 0 hours 14 minutes 9 seconds  Findings:                 Large hemorrhoids were found on perianal exam.                           A few small-mouthed diverticula were found in the                            sigmoid colon.                           Four sessile polyps were found in the sigmoid colon                            (3) and descending colon. The polyps were 2 to 3 mm                            in size. These polyps were removed with a cold                            biopsy forceps. Resection and retrieval were  complete. Estimated blood loss was minimal.                           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 Biopsies were taken with a cold forceps for                            histology. Estimated blood loss was minimal.                           Non-bleeding internal hemorrhoids were found during                            retroflexion. The hemorrhoids were Grade IV                            (internal hemorrhoids that prolapse and cannot be                            reduced manually). Complications:            No immediate complications. Estimated Blood Loss:     Estimated blood loss was minimal. Impression:               - Hemorrhoids found on perianal exam.                           - Diverticulosis in the sigmoid colon.                           - Four 2 to 3 mm polyps in the sigmoid colon and in                            the descending colon,  removed with a cold biopsy                            forceps. Resected and retrieved.                           - Melanosis in the colon. Biopsied.                           - Non-bleeding internal hemorrhoids. Recommendation:           - Patient has a contact number available for                            emergencies. The signs and symptoms of potential                            delayed complications were discussed with the                            patient. Return  to normal activities tomorrow.                            Written discharge instructions were provided to the                            patient.                           - Soft diet today.                           - Continue present medications.                           - Await pathology results.                           - Repeat colonoscopy for surveillance based on                            pathology results.                           - Return to GI clinic in 2 months.                           - If ongoing symptoms, can consider referral to                            Colo-Rectal Surgery for evaluation and treatment of                            large, Grade 4 hemorrhoids. Gerrit Heck, MD 01/15/2020 3:54:36 PM

## 2020-01-15 NOTE — Op Note (Signed)
[facility] Patient Name: Danielle Curtis Procedure Date: 01/15/2020 2:52 PM MRN: [id number] Endoscopist: Gerrit Heck , MD Age: 59 Referring MD:  Date of Birth: 04/16/1961 Gender: Female Account #: [id number] Procedure:                Upper GI endoscopy Indications:              Dysphagia Medicines:                Monitored Anesthesia Care Procedure:                Pre-Anesthesia Assessment:                           - Prior to the procedure, a History and Physical                            was performed, and patient medications and                            allergies were reviewed. The patient's tolerance of                            previous anesthesia was also reviewed. The risks                            and benefits of the procedure and the sedation                            options and risks were discussed with the patient.                            All questions were answered, and informed consent                            was obtained. Prior Anticoagulants: The patient has                            taken no previous anticoagulant or antiplatelet                            agents. ASA Grade Assessment: II - A patient with                            mild systemic disease. After reviewing the risks                            and benefits, the patient was deemed in                            satisfactory condition to undergo the procedure.                           After obtaining informed consent, the endoscope was  passed under direct vision. Throughout the                            procedure, the patient's blood pressure, pulse, and                            oxygen saturations were monitored continuously. The                            Endoscope was introduced through the mouth, and                            advanced to the second part of duodenum. The upper                            GI endoscopy was accomplished without  difficulty.                            The patient tolerated the procedure well. Scope In: Scope Out: Findings:                 The upper third of the esophagus and middle third                            of the esophagus were normal.                           A mild Schatzki ring was found in the lower third                            of the esophagus. A TTS dilator was passed through                            the scope. Dilation with an 18-19-20 mm balloon                            dilator was performed to 20 mm. The dilation site                            was examined and showed no bleeding, mucosal tear                            or perforation. This was then biopsied with a cold                            forceps for fracturing of the ring. Estimated blood                            loss was minimal.                           One superficial esophageal ulcer with no stigmata  of recent bleeding was found 38 cm from the                            incisors, and the [place]. The lesion was 4 mm                            in largest dimension. Biopsies were taken with a                            cold forceps for histology. Estimated blood loss                            was minimal.                           Localized mild inflammation characterized by                            erythema was found in the gastric antrum. Biopsies                            were taken with a cold forceps for histology.                            Estimated blood loss was minimal.                           The gastric fundus and gastric body were normal.                           The duodenal bulb, first portion of the duodenum                            and second portion of the duodenum were normal. Complications:            No immediate complications. Estimated Blood Loss:     Estimated blood loss was minimal. Impression:               - Normal upper third of esophagus  and middle third                            of esophagus.                           - Mild Schatzki ring. Dilated. Biopsied.                           - Esophageal ulcer with no stigmata of recent                            bleeding. Biopsied.                           - Gastritis. Biopsied.                           -  Normal gastric fundus and gastric body.                           - Normal duodenal bulb, first portion of the                            duodenum and second portion of the duodenum. Recommendation:           - Patient has a contact number available for                            emergencies. The signs and symptoms of potential                            delayed complications were discussed with the                            patient. Return to normal activities tomorrow.                            Written discharge instructions were provided to the                            patient.                           - Soft diet today then advance as tolerated                            tomorrow.                           - Continue present medications.                           - Await pathology results.                           - Repeat upper endoscopy PRN for retreatment.                           - Return to GI clinic at appointment to be                            scheduled. Gerrit Heck, MD 01/15/2020 3:44:30 PM

## 2020-01-17 ENCOUNTER — Telehealth: Payer: Self-pay

## 2020-01-17 NOTE — Telephone Encounter (Signed)
  Follow up Call-  Call back number 01/15/2020  Post procedure Call Back phone  # [phone]  Permission to leave phone message Yes  Some recent data might be hidden     Patient questions:  Do you have a fever, pain , or abdominal swelling? No. Pain Score  0 *  Have you tolerated food without any problems? Yes.    Have you been able to return to your normal activities? Yes.    Do you have any questions about your discharge instructions: Diet   No. Medications  No. Follow up visit  No.  Do you have questions or concerns about your Care? No.  Actions: * If pain score is 4 or above: No action needed, pain <4.  1. Have you developed a fever since your procedure? no  2.   Have you had an respiratory symptoms (SOB or cough) since your procedure? no  3.   Have you tested positive for COVID 19 since your procedure no  4.   Have you had any family members/close contacts diagnosed with the COVID 19 since your procedure?  no   If yes to any of these questions please route to Joylene John, RN and Erenest Rasher, RN

## 2020-01-17 NOTE — Telephone Encounter (Signed)
Left message on follow up call. 

## 2020-01-23 ENCOUNTER — Other Ambulatory Visit: Payer: Self-pay | Admitting: Gastroenterology

## 2020-01-23 MED ORDER — ACYCLOVIR 400 MG PO TABS: 400.0000 mg | ORAL_TABLET | Freq: Three times a day (TID) | ORAL | 0 refills | Status: AC

## 2020-01-24 ENCOUNTER — Ambulatory Visit: Payer: BLUE CROSS/BLUE SHIELD | Admitting: Gastroenterology

## 2020-02-04 DIAGNOSIS — K641 Second degree hemorrhoids: Secondary | ICD-10-CM | POA: Diagnosis not present

## 2020-02-05 DIAGNOSIS — D225 Melanocytic nevi of trunk: Secondary | ICD-10-CM | POA: Diagnosis not present

## 2020-02-05 DIAGNOSIS — D1801 Hemangioma of skin and subcutaneous tissue: Secondary | ICD-10-CM | POA: Diagnosis not present

## 2020-02-05 DIAGNOSIS — D485 Neoplasm of uncertain behavior of skin: Secondary | ICD-10-CM | POA: Diagnosis not present

## 2020-02-05 DIAGNOSIS — D2239 Melanocytic nevi of other parts of face: Secondary | ICD-10-CM | POA: Diagnosis not present

## 2020-02-05 DIAGNOSIS — L578 Other skin changes due to chronic exposure to nonionizing radiation: Secondary | ICD-10-CM | POA: Diagnosis not present

## 2020-03-27 DIAGNOSIS — R1013 Epigastric pain: Secondary | ICD-10-CM | POA: Diagnosis not present

## 2020-03-27 DIAGNOSIS — M0579 Rheumatoid arthritis with rheumatoid factor of multiple sites without organ or systems involvement: Secondary | ICD-10-CM | POA: Diagnosis not present

## 2020-03-27 DIAGNOSIS — H209 Unspecified iridocyclitis: Secondary | ICD-10-CM | POA: Diagnosis not present

## 2020-03-27 DIAGNOSIS — M2550 Pain in unspecified joint: Secondary | ICD-10-CM | POA: Diagnosis not present

## 2020-03-27 DIAGNOSIS — G629 Polyneuropathy, unspecified: Secondary | ICD-10-CM | POA: Diagnosis not present

## 2020-04-29 DIAGNOSIS — D485 Neoplasm of uncertain behavior of skin: Secondary | ICD-10-CM | POA: Diagnosis not present

## 2020-05-19 DIAGNOSIS — Z20822 Contact with and (suspected) exposure to covid-19: Secondary | ICD-10-CM | POA: Diagnosis not present

## 2020-06-02 DIAGNOSIS — Z20822 Contact with and (suspected) exposure to covid-19: Secondary | ICD-10-CM | POA: Diagnosis not present

## 2020-06-04 DIAGNOSIS — R059 Cough, unspecified: Secondary | ICD-10-CM | POA: Diagnosis not present

## 2020-06-04 DIAGNOSIS — J069 Acute upper respiratory infection, unspecified: Secondary | ICD-10-CM | POA: Diagnosis not present

## 2020-06-19 DIAGNOSIS — M9981 Other biomechanical lesions of cervical region: Secondary | ICD-10-CM | POA: Diagnosis not present

## 2020-06-19 DIAGNOSIS — M5412 Radiculopathy, cervical region: Secondary | ICD-10-CM | POA: Diagnosis not present

## 2020-06-28 ENCOUNTER — Other Ambulatory Visit: Payer: Self-pay | Admitting: Gastroenterology

## 2020-07-02 DIAGNOSIS — M9981 Other biomechanical lesions of cervical region: Secondary | ICD-10-CM | POA: Diagnosis not present

## 2020-07-03 DIAGNOSIS — M542 Cervicalgia: Secondary | ICD-10-CM | POA: Diagnosis not present

## 2020-07-03 DIAGNOSIS — M069 Rheumatoid arthritis, unspecified: Secondary | ICD-10-CM | POA: Diagnosis not present

## 2020-07-03 DIAGNOSIS — G8929 Other chronic pain: Secondary | ICD-10-CM | POA: Diagnosis not present

## 2020-08-12 ENCOUNTER — Telehealth: Payer: Self-pay | Admitting: Gastroenterology

## 2020-08-13 NOTE — Telephone Encounter (Signed)
Received a message from patient relations regarding a website concern for [facility] procedure billing.  Spouse had placed concern regarding wife's procedure out-of-pocket expenses.  Requested billing and coding to review the patient charges.  Elnita Maxwell confirmed the coding was appropriate, as the referral was for diagnostic procedures not screening.  The patient had and EGD/Colonoscopy for complaints voiced at an OV in May 2021.  The [facility] procedures were scheduled based on referral from the office and noted rectal bleeding and previous biopsies for colonoscopy referral.    Chart was reviewed and confirmed above information was correct.  Contacted spouse, Thayer Ohm (permission allowed), to discuss. He acknowledged our points on the referral but voiced he was still not in agreement that rectal bleeding should be used because this was such a common diagnosis and it created increased costs for the consumer.  Explained since the OV and patient concerns were directly associated with the referral that we do consider that a diagnostic procedure.    Spouse states the website promotes screening colonoscopies but does not explain the additional costs for procedures not considered screenings.

## 2020-09-18 ENCOUNTER — Encounter: Payer: Self-pay | Admitting: Neurology

## 2020-09-18 ENCOUNTER — Other Ambulatory Visit: Payer: Self-pay

## 2020-09-18 ENCOUNTER — Ambulatory Visit: Payer: BC Managed Care – PPO | Admitting: Neurology

## 2020-09-18 VITALS — BP 147/94 | HR 75 | Ht 65.0 in | Wt 184.0 lb

## 2020-09-18 DIAGNOSIS — M542 Cervicalgia: Secondary | ICD-10-CM

## 2020-09-18 MED ORDER — NORTRIPTYLINE HCL 10 MG PO CAPS
ORAL_CAPSULE | ORAL | 2 refills | Status: DC
Start: 2020-09-18 — End: 2020-10-13

## 2020-09-18 MED ORDER — BACLOFEN 10 MG PO TABS: 5.0000 mg | ORAL_TABLET | Freq: Two times a day (BID) | ORAL | 2 refills | Status: DC

## 2020-09-18 MED ORDER — PREDNISONE 5 MG PO TABS: ORAL_TABLET | ORAL | 0 refills | Status: DC

## 2020-09-18 NOTE — Progress Notes (Signed)
Reason for visit: Leg pain, right arm discomfort  Referring physician: Dr. Michell Curtis is a 60 y.o. female  History of present illness:  Danielle Curtis is a 60 year old right-handed white female with a chronic history of neck pain.  She began having neck pain initially in 2016 that was based primarily on the right side of her neck.  She was set up for cervical spine surgery after MRI revealed evidence of a bone spur.  Previously, she underwent physical therapy without much benefit and she had 2 epidural steroid injections, this seemed to benefit her slightly.  She has had pain over the last several years but it had subdued significantly but within the last 3 months, the pain has significantly worsened.  She is having severe pain in the base of the neck on the right with headaches going up into the back of the head.  She has discomfort into the shoulder and some numbness that goes down the arm to the hand.  She has symptoms primarily on the right side, not as much on the left.  She has severe restriction of movement when turning her head to the right.  When she turns her head to the right it increases her symptoms down her right arm.  The patient has been diagnosed with rheumatoid arthritis and is being actively managed for this through rheumatology, she is on Enbrel.  The patient does not believe that there is any weakness of the extremities.  She denies problems with the legs or difficulty with any changes in balance.  She denies problems controlling the bowels or the bladder.  When the pain is severe, she may have some nausea from this.  She has been taking ibuprofen.  She is on baclofen 20 mg at night.  In the past, she could not tolerate gabapentin or Lyrica.  She has been using a TENS unit without much benefit.  She comes to this office for further evaluation.  The last MRI of the cervical spine was done in 2017.  Past Medical History:  Diagnosis Date  . High cholesterol   . RA  (rheumatoid arthritis) ([place])     Past Surgical History:  Procedure Laterality Date  . ABDOMINAL HYSTERECTOMY     around 1995  . CHOLECYSTECTOMY     around 2010  . COLONOSCOPY  05/21/2013   Dr Orlena Sheldon    Family History  Problem Relation Age of Onset  . Prostate cancer Father   . Breast cancer Sister   . Ovarian cancer Sister        older sister  . Heart disease Other   . Cancer Other   . Colon cancer Neg Hx   . Esophageal cancer Neg Hx     Social history:  reports that she has quit smoking. She has never used smokeless tobacco. She reports that she does not drink alcohol and does not use drugs.  Medications:  Prior to Admission medications   Medication Sig Start Date End Date Taking? Authorizing Provider  AMBULATORY NON FORMULARY MEDICATION 2 tablets daily. Dr Schulze's intestinal formula #1   Yes [provider]  baclofen (LIORESAL) 20 MG tablet Take 20 mg by mouth. 1 or 2 per day   Yes [provider]  Etanercept (ENBREL [place]) Inject 50 mg into the skin once a week.   Yes [provider]  ibuprofen (ADVIL) 800 MG tablet Take 800 mg by mouth 3 (three) times daily.   Yes [provider]  MAGNESIUM CITRATE PO Take 1 tablet by mouth daily.   Yes [provider]  omeprazole (PRILOSEC) 20 MG capsule Take 1 capsule (20 mg total) by mouth daily. Patient taking differently: Take 20 mg by mouth daily. As needed. 01/07/20  Yes Cirigliano, Vito V, DO      Allergies  Allergen Reactions  . Pregabalin Swelling and Other (See Comments)    MOBILITY PROBLEMS  "nearly killed me"  . Gabapentin Other (See Comments)    Experienced side effects of the medication Mobility issues "nearly killed me"  . Codeine     "makes my skin crawl, makes me very agitated"    ROS:  Out of a complete 14 system review of symptoms, the patient complains only of the following symptoms, and all other reviewed systems are negative.  Neck pain, right arm  discomfort Joint pains  Blood pressure (!) 147/94, pulse 75, height 5\' 5"  (1.651 m), weight 184 lb (83.5 kg).  Physical Exam  General: The patient is alert and cooperative at the time of the examination.  Eyes: Pupils are equal, round, and reactive to light. Discs are flat bilaterally.  Neck: The neck is supple, no carotid bruits are noted.  Respiratory: The respiratory examination is clear.  Cardiovascular: The cardiovascular examination reveals a regular rate and rhythm, no obvious murmurs or rubs are noted.  Neuromuscular: The patient has severe limitation of movement of the head to the right with rotational movement, she lacks about 15 to 20 degrees with movement to the left.  Skin: Extremities are without significant edema.  Neurologic Exam  Mental status: The patient is alert and oriented x 3 at the time of the examination. The patient has apparent normal recent and remote memory, with an apparently normal attention span and concentration ability.  Cranial nerves: Facial symmetry is present. There is good sensation of the face to pinprick and soft touch bilaterally. The strength of the facial muscles and the muscles to head turning and shoulder shrug are normal bilaterally. Speech is well enunciated, no aphasia or dysarthria is noted. Extraocular movements are full. Visual fields are full. The tongue is midline, and the patient has symmetric elevation of the soft palate. No obvious hearing deficits are noted.  Motor: The motor testing reveals 5 over 5 strength of all 4 extremities. Good symmetric motor tone is noted throughout.  Sensory: Sensory testing is intact to pinprick, soft touch, vibration sensation, and position sense on all 4 extremities. No evidence of extinction is noted.  Coordination: Cerebellar testing reveals good finger-nose-finger and heel-to-shin bilaterally.  Gait and station: Gait is normal. Tandem gait is normal. Romberg is negative. No drift is  seen.  Reflexes: Deep tendon reflexes are symmetric and normal bilaterally. Toes are downgoing bilaterally.   Assessment/Plan:  1.  Chronic neck pain, right  2.  History of rheumatoid arthritis  The patient appears to have neuromuscular discomfort involving the right neck and shoulder with tingling going down the right arm.  My first impression is that she is having a lot of muscle spasm and tightness in the neck, she claims that physical therapy previously worsen the pain.  The patient will be set up for MRI of the cervical spine.  She will go on a prednisone Dosepak for the next 12 days, and start nortriptyline in the evening, working up on the dose.  She will increase her baclofen dose taking 5 mg twice during the day and 20 mg at night.  She will follow up here in  3 months.  If the findings on MRI are unremarkable, I would like to get her back into physical therapy with massage, heat, ultrasound, and dry needling along with gentle stretches.  Jill Alexanders MD 09/18/2020 9:56 AM  [facility] [address] [place], [place] [address]  Phone [phone] Fax [phone]

## 2020-09-18 NOTE — Patient Instructions (Signed)
We will start a prednisone dosepack for 12 days, and start nortriptyline at night for the neck pain.  Use low dose baclofen during the day.  Pamelor (nortriptyline) is an antidepressant medication that has many uses that may include headache, whiplash injuries, or for peripheral neuropathy pain. Side effects may include drowsiness, dry mouth, blurred vision, or constipation. As with any antidepressant medication, worsening depression may occur. If you had any significant side effects, please call our office. The full effects of this medication may take 7-10 days after starting the drug, or going up on the dose.

## 2020-09-22 ENCOUNTER — Telehealth: Payer: Self-pay | Admitting: Neurology

## 2020-09-22 NOTE — Telephone Encounter (Signed)
[employer] pending sign location since its a [place] location plan. Faxed clinical notes.

## 2020-09-25 DIAGNOSIS — H209 Unspecified iridocyclitis: Secondary | ICD-10-CM | POA: Diagnosis not present

## 2020-09-25 DIAGNOSIS — R1013 Epigastric pain: Secondary | ICD-10-CM | POA: Diagnosis not present

## 2020-09-25 DIAGNOSIS — M2550 Pain in unspecified joint: Secondary | ICD-10-CM | POA: Diagnosis not present

## 2020-09-25 DIAGNOSIS — M0579 Rheumatoid arthritis with rheumatoid factor of multiple sites without organ or systems involvement: Secondary | ICD-10-CM | POA: Diagnosis not present

## 2020-09-25 NOTE — Telephone Encounter (Signed)
[employer] did not approve the MRI Cervical spine.  "Based on [employer] guidelines Nec pain without and with Neurological features (including stenosis) we cannot approve this request. Your records show that you have a long-standing histroy of neck pain. The reason this request cannot be approved is because:  A study similar to the one requested has recently been performed. The results of that study showed your what they needed to see in order to treat you condition. No additional imaging is necessary at this time. (when I called the insurance I informed them it looks like she has not had a MRI Cervical since 2017 and that was awhile ago)  Imaging requires six weeks of provider directed treatment to be completed. Supported treatments include (but are not limited to) drugs for swelling or pain, an in office workout ( physical therapy), and/or oral or injected steroids. This must have been completed in the past three months without improved symptoms. Contact (via office visit, phone, email or messaging) must occur after the treatment is completed.   There are two options a reconsideration a letter written faxed along with the clinical notes or a peer to peer. If the reconsideration still gets denied they informed me there is still an option to do a peer to peer.  The peer to peer phone number is [phone] option 1. The case number is [id number].   When I started the case I did fax the clinical notes and they received them.

## 2020-09-26 NOTE — Telephone Encounter (Signed)
I called the patient.  The insurance company has denied the MRI study, they require a 6-week timeframe of conservative management.  I will call the patient in 5 weeks and we will reorder the MRI study.

## 2020-09-29 NOTE — Telephone Encounter (Signed)
Noted, thank you

## 2020-10-09 DIAGNOSIS — Z20822 Contact with and (suspected) exposure to covid-19: Secondary | ICD-10-CM | POA: Diagnosis not present

## 2020-10-09 DIAGNOSIS — R6889 Other general symptoms and signs: Secondary | ICD-10-CM | POA: Diagnosis not present

## 2020-10-09 DIAGNOSIS — R059 Cough, unspecified: Secondary | ICD-10-CM | POA: Diagnosis not present

## 2020-10-10 ENCOUNTER — Other Ambulatory Visit: Payer: Self-pay | Admitting: Neurology

## 2020-10-13 DIAGNOSIS — Z20822 Contact with and (suspected) exposure to covid-19: Secondary | ICD-10-CM | POA: Diagnosis not present

## 2020-10-29 ENCOUNTER — Telehealth: Payer: Self-pay | Admitting: Neurology

## 2020-10-29 DIAGNOSIS — M542 Cervicalgia: Secondary | ICD-10-CM

## 2020-10-29 NOTE — Telephone Encounter (Signed)
I called [employer] and spoke with Jenny Reichmann A to see what needs to be done since this MRI was denied. They informed me there is still an option for a peer to peer or reconsideration.   If you want to do the peer to peer the phone number is s [phone] option 1. The case number is [id number].   For reconsideration they said to fax along notes and additional clinical information.

## 2020-10-29 NOTE — Telephone Encounter (Signed)
I called the patient.  The patient still having some neck pain, the medications have helped some but she continues to have discomfort, I will reorder the MRI of the cervical spine.

## 2020-10-30 NOTE — Telephone Encounter (Signed)
I did a peer review with the insurance physician, the MRI of the cervical spine has been approved.  Approval number is [id number] with the expiration date of Dec 29, 2020.

## 2020-11-03 NOTE — Telephone Encounter (Signed)
Noted, thank you!  [employer] Josem Kaufmann: [id number] (exp. 12/29/20) order sent to GI. They will reach out to the patient to schedule.

## 2020-11-05 ENCOUNTER — Other Ambulatory Visit: Payer: Self-pay | Admitting: Family Medicine

## 2020-11-05 ENCOUNTER — Ambulatory Visit
Admission: RE | Admit: 2020-11-05 | Discharge: 2020-11-05 | Disposition: A | Discharge status: Home / Self Care | Payer: BC Managed Care – PPO | Source: Ambulatory Visit | Attending: Neurology | Admitting: Neurology

## 2020-11-05 ENCOUNTER — Other Ambulatory Visit: Payer: Self-pay

## 2020-11-05 DIAGNOSIS — M542 Cervicalgia: Secondary | ICD-10-CM | POA: Diagnosis not present

## 2020-11-07 ENCOUNTER — Telehealth: Payer: Self-pay | Admitting: Neurology

## 2020-11-07 DIAGNOSIS — M542 Cervicalgia: Secondary | ICD-10-CM

## 2020-11-07 NOTE — Telephone Encounter (Signed)
I called the patient.  The MRI of the cervical spine is normal.  The medications with combination of baclofen and nortriptyline seem to have resulted in a good improvement in pain.  The patient may be amenable to physical therapy, but she is going to [place] for 2 weeks and she will call me when she comes back when she is ready for physical therapy.    MRI cervical 11/06/20:  IMPRESSION:   Unremarkable MRI cervical spine (without). No spinal stenosis or foraminal narrowing.

## 2020-11-24 NOTE — Telephone Encounter (Signed)
I called the patient.  She is back from [place], she wishes to start physical therapy for the neck and shoulders at this point.  I will get this set up.

## 2020-11-24 NOTE — Addendum Note (Signed)
Addended by: Kathrynn Ducking on: 11/24/2020 10:54 AM   Modules accepted: Orders

## 2020-11-24 NOTE — Telephone Encounter (Signed)
Pt called to discuss the PT she is to receive. Please advise.

## 2020-12-08 ENCOUNTER — Encounter: Payer: Self-pay | Admitting: Physical Therapy

## 2020-12-08 ENCOUNTER — Ambulatory Visit: Payer: BC Managed Care – PPO | Attending: Neurology | Admitting: Physical Therapy

## 2020-12-08 ENCOUNTER — Other Ambulatory Visit: Payer: Self-pay

## 2020-12-08 DIAGNOSIS — M6281 Muscle weakness (generalized): Secondary | ICD-10-CM | POA: Diagnosis not present

## 2020-12-08 DIAGNOSIS — R42 Dizziness and giddiness: Secondary | ICD-10-CM | POA: Diagnosis not present

## 2020-12-08 DIAGNOSIS — M542 Cervicalgia: Secondary | ICD-10-CM | POA: Diagnosis not present

## 2020-12-08 DIAGNOSIS — R293 Abnormal posture: Secondary | ICD-10-CM | POA: Insufficient documentation

## 2020-12-08 DIAGNOSIS — R201 Hypoesthesia of skin: Secondary | ICD-10-CM | POA: Diagnosis not present

## 2020-12-09 NOTE — Therapy (Addendum)
[facility] [address] [place], [place], [address] Phone: [phone]   Fax:  [phone]  Physical Therapy Evaluation  Patient Details  Name: Danielle Curtis MRN: [id number] Date of Birth: 26-Jul-1961 Referring Provider (PT): Dr. Jannifer Franklin   Encounter Date: 12/08/2020   PT End of Session - 12/09/20 1730    Visit Number 1    Number of Visits 17    Date for PT Re-Evaluation 03/09/21    Authorization Type BCBS    PT Start Time 9357   therapist running late   PT Stop Time 1023    PT Time Calculation (min) 48 min    Activity Tolerance Patient tolerated treatment well    Behavior During Therapy [facility] for tasks assessed/performed           Past Medical History:  Diagnosis Date  . High cholesterol   . RA (rheumatoid arthritis) ([place])     Past Surgical History:  Procedure Laterality Date  . ABDOMINAL HYSTERECTOMY     around 1995  . CHOLECYSTECTOMY     around 2010  . COLONOSCOPY  05/21/2013   Dr Orlena Sheldon    There were no vitals filed for this visit.    Subjective Assessment - 12/08/20 0939    Subjective She has had neck pain over the last several years but it had subdued significantly but within the past few months the pain has significantly worsened. Does not know the reason why her pain started. Has frequent headaches. Has some dizziness - it is just at random, not sure what causes it. Has pain/numbness that goes down her right arm into the head with over exertion. Also has pain in the middle of her neck. Reports when she turns her head to the right, makes her pain worse. Reports has been feeling better since Dr.Willis gave her predisone (12 day pack) and nortriptyline from her visit in February.  Had physical therapy before and did not have a good experience, feels like she got worse. Reports having incr stress this morning, and that can possibly be a trigger for her pain.    Pertinent History RA, chronic neck pain     Diagnostic tests MRI 11/06/20: Unremarkable MRI cervical spine (without). No spinal stenosis or foraminal narrowing.    Patient Stated Goals wants to relieve her pain.    Currently in Pain? No/denies   "pain is always there, but feels like she is used to it" reports having a high pain tolerance             [facility] PT Assessment - 12/08/20 0947      Assessment   Medical Diagnosis chronic neck pain/headaches    Referring Provider (PT) Dr. Jannifer Franklin    Onset Date/Surgical Date 11/24/20    Hand Dominance Right    Prior Therapy previous PT for neck pain (pt reports it made it worse)      Balance Screen   Has the patient fallen in the past 6 months No    Has the patient had a decrease in activity level because of a fear of falling?  No    Is the patient reluctant to leave their home because of a fear of falling?  No      Home Environment   Living Environment Private residence    Living Arrangements Spouse/significant other      Prior Function   Level of Independence Independent    Vocation Other (comment)   does not work   Leisure likes  walking, travels a lot      Sensation   Light Touch Appears Intact    Additional Comments reports constant N/T down right arm      Coordination   Fine Motor Movements are Fluid and Coordinated Yes      Posture/Postural Control   Posture/Postural Control Postural limitations    Postural Limitations Rounded Shoulders;Forward head    Posture Comments R shoulder more elevated than L      ROM / Strength   AROM / PROM / Strength AROM;Strength      AROM   Overall AROM Comments reports improvement in pain with B shoulder flexion/ABD, B shoulder AROM WFL    AROM Assessment Site Cervical    Cervical Flexion 70    Cervical Extension 35    Cervical - Right Side Bend 25   2-3/10 pain going to R   Cervical - Left Side Bend 25    Cervical - Right Rotation 40   6-7/10 pain, feels a little nauseous going to R   Cervical - Left Rotation 50      Strength    Overall Strength Comments 5/5 remainder of BUE myotomes. mid and lower trap: 4/5 LUE, 3+/5 RUE    Strength Assessment Site Shoulder;Thoracic    Right/Left Shoulder Right;Left    Right Shoulder Flexion 5/5    Right Shoulder ABduction 5/5    Left Shoulder Flexion 5/5    Left Shoulder ABduction 5/5      Palpation   Spinal mobility incr tenderness/hypomobility at central C5-C7, upper thoracic spine, and R C5-C7 facet joints, upper thoracic facet joints    Palpation comment diffuse incr TTP R upper trap, R levator scap, cervical paraspinals      Special Tests    Special Tests Cervical    Cervical Tests Spurling's;Dictraction      Spurling's   Findings Negative    Comment both sides      Distraction Test   Findngs Negative    Comment With gentle distraction, reports significant relief in neck pain, no change in radicular sx                      Objective measurements completed on examination: See above findings.               PT Education - 12/09/20 1730    Education Details clinical findings, POC    Person(s) Educated Patient    Methods Explanation    Comprehension Verbalized understanding              PT Short Term Goals - 12/10/20 0908      PT SHORT TERM GOAL #1   Title Pt will be independent with initial HEP in order to build upon functional gains made in therapy. ALL STGS DUE 01/07/21    Time 4    Period Weeks    Status New    Target Date 01/07/21      PT SHORT TERM GOAL #2   Title Pt will perform NDI to assess disability related to neck pain, with LTG written as appropriate.    Time 4    Period Weeks    Status New      PT SHORT TERM GOAL #3   Title Pt will undergo further vestibular assessment with LTG written as appropriate.    Time 4    Period Weeks    Status New      PT SHORT TERM GOAL #4   Title  Pt will improve R cervical rotation to at least 45 deg with a 4/10 pain or less in order to improve ROM for driving    Baseline 40  degrees, 6-7/10 pain when turning neck    Time 4    Period Weeks      PT SHORT TERM GOAL #5   Title Pt will improve cervical sidebending ROM to at least 30 degrees B with reports of 1/10 pain or less when side bending to R.    Baseline 25 degrees B, 2-3/10 pain to R side    Time 4    Period Weeks    Status New              PT Long Term Goals - 12/10/20 0914      PT LONG TERM GOAL #1   Title Pt will be independent with final HEP in order to build upon functional gains made in therapy.    Time 8    Period Weeks    Status New    Target Date 02/04/21      PT LONG TERM GOAL #2   Title NDI goal to be written as appropriate in order to demo decr disability.    Time 8    Period Weeks    Status New      PT LONG TERM GOAL #3   Title Vestibular goal to be written as appropriate based on assessment.    Time 8    Period Weeks    Status New      PT LONG TERM GOAL #4   Title Pt will improve R cervical rotation AROM to at least 55 deg with a 1/10 pain or less in order to improve ROM for driving    Baseline 40 degrees, 6-7/10 pain    Time 8    Period Weeks    Status New      PT LONG TERM GOAL #5   Title Pt will improve L cervical rotation AROM  to at least 60 degrees for improved ROM for driving.    Baseline 50 degrees, no pain when performing    Time 8    Period Weeks    Status New                12/10/20 0917  Plan  Clinical Impression Statement Patient is a 60 year old female referred to Neuro OPPT for chronic neck pain. Had pain over the past few years, but has worsened in the past few months. Has improved since seeing Dr. Jannifer Franklin recently and was prescribed medication. MRI from 11/06/20 of cervical spine was unremarkable. Pt also reports that she has had headaches and has some dizziness (more so when turning her neck, none noted today).  Pt's PMH is significant for: RA.  The following deficits were present during the exam: postural abnormalities, decr strength of  postural musculature, decr ROM of cervical spine, hypomobility of central and rigt upper thoracic and lower cervical spine, impaired sensation, incr tenderness of upper trap/levator scap and parascapular musculature. Pt reports neck pain relief from gentle cervical distraction, but no change in radicular sx down R arm. Pt would benefit from skilled PT to address these impairments and functional limitations to maximize functional mobility independence  Personal Factors and Comorbidities Comorbidity 1;Time since onset of injury/illness/exacerbation;Past/Current Experience  Comorbidities RA  Examination-Activity Limitations  (pain interferes with daily activities)  Examination-Participation Restrictions Driving (pt still drives, but difficulty turning her head)  Pt will benefit from skilled therapeutic  intervention in order to improve on the following deficits Decreased range of motion;Decreased strength;Hypomobility;Dizziness;Increased muscle spasms;Impaired flexibility;Impaired sensation;Postural dysfunction;Pain  Stability/Clinical Decision Making Stable/Uncomplicated  Clinical Decision Making Low  Rehab Potential Good  PT Frequency 2x / week  PT Duration 8 weeks  PT Treatment/Interventions ADLs/Self Care Home Management;Canalith Repostionin;Therapeutic exercise;Balance training;Neuromuscular re-education;Therapeutic activities;Patient/family education;Manual techniques;Passive range of motion;Dry needling;Vestibular  PT Next Visit Plan further assess nerve glides due to tingling down RUE, NuStep, initial HEP - gentle cervical ROM, upper trap/levator stretch, postural strengthening, breathing techniques for stress management. will need further vestibular assessment.  Consulted and Agree with Plan of Care Patient         Patient will benefit from skilled therapeutic intervention in order to improve the following deficits and impairments:     Visit Diagnosis: Cervicalgia  Impaired  sensation  Abnormal posture  Dizziness and giddiness  Muscle weakness (generalized)     Problem List Patient Active Problem List   Diagnosis Date Noted  . Dysphagia 01/07/2020  . Rectal bleeding 01/07/2020    Arliss Journey, PT, DPT  12/09/2020, 5:32 PM  [facility] [address] [place], [place], [address] Phone: [phone]   Fax:  [phone]  Name: Danielle Curtis MRN: [id number] Date of Birth: 01-03-1961

## 2020-12-10 NOTE — Addendum Note (Signed)
Addended by: Arliss Journey on: 12/10/2020 09:28 AM   Modules accepted: Orders

## 2020-12-14 ENCOUNTER — Other Ambulatory Visit: Payer: Self-pay | Admitting: Neurology

## 2020-12-14 DIAGNOSIS — M542 Cervicalgia: Secondary | ICD-10-CM

## 2020-12-15 ENCOUNTER — Other Ambulatory Visit: Payer: Self-pay

## 2020-12-15 ENCOUNTER — Ambulatory Visit: Payer: BC Managed Care – PPO | Attending: Neurology | Admitting: Physical Therapy

## 2020-12-15 ENCOUNTER — Encounter: Payer: Self-pay | Admitting: Physical Therapy

## 2020-12-15 DIAGNOSIS — R42 Dizziness and giddiness: Secondary | ICD-10-CM | POA: Diagnosis not present

## 2020-12-15 DIAGNOSIS — M6281 Muscle weakness (generalized): Secondary | ICD-10-CM | POA: Insufficient documentation

## 2020-12-15 DIAGNOSIS — R293 Abnormal posture: Secondary | ICD-10-CM | POA: Diagnosis not present

## 2020-12-15 DIAGNOSIS — M542 Cervicalgia: Secondary | ICD-10-CM | POA: Diagnosis not present

## 2020-12-15 NOTE — Therapy (Signed)
[facility] [address] [place], [place], [address] Phone: [phone]   Fax:  [phone]  Physical Therapy Treatment  Patient Details  Name: Danielle Curtis MRN: [id number] Date of Birth: [birth year] Referring Provider (PT): Dr. Jannifer Franklin   Encounter Date: 12/15/2020   PT End of Session - 12/15/20 0940    Visit Number 2    Number of Visits 17    Date for PT Re-Evaluation 03/09/21    Authorization Type BCBS    PT Start Time 0849    PT Stop Time 0933    PT Time Calculation (min) 44 min    Activity Tolerance Patient tolerated treatment well    Behavior During Therapy [facility] for tasks assessed/performed           Past Medical History:  Diagnosis Date  . High cholesterol   . RA (rheumatoid arthritis) ([place])     Past Surgical History:  Procedure Laterality Date  . ABDOMINAL HYSTERECTOMY     around 1995  . CHOLECYSTECTOMY     around 2010  . COLONOSCOPY  05/21/2013   Dr Orlena Sheldon    There were no vitals filed for this visit.   Subjective Assessment - 12/15/20 0850    Subjective did not feel sore after the eval. Feels like her pain was relieved a bit.    Pertinent History RA, chronic neck pain    Diagnostic tests MRI 11/06/20: Unremarkable MRI cervical spine (without). No spinal stenosis or foraminal narrowing.    Patient Stated Goals wants to relieve her pain.    Currently in Pain? Yes    Pain Score 2     Pain Location Neck    Pain Orientation Right    Pain Descriptors / Indicators Aching    Pain Type Chronic pain    Aggravating Factors  just hurts all the time    Pain Relieving Factors took some ibuprofen this morning              [facility] PT Assessment - 12/15/20 0943      Observation/Other Assessments   Other Surveys  Neck Disability Index    Neck Disability Index  24% = moderate disability                         [facility] Adult PT Treatment/Exercise - 12/15/20 0943      Exercises    Exercises Other Exercises;Neck    Other Exercises  performed gentle cervical rotation SNAG with towel x5 reps B, needing verbal, demo and intermittent manual cues for proper technique, cues for gentle motion, pt reporting that it slightly incr her headache      Neck Exercises: Stretches   Upper Trapezius Stretch Right;3 reps;30 seconds    Upper Trapezius Stretch Limitations therapist performing stretch to pt's tolerance, pt supine on mat table              Access Code: [email or web address] URL: [email or web address] Date: 12/15/2020 Prepared by: Janann August  Initiated HEP. See MedBridge for more details.   Exercises Seated Cervical Rotation AROM - 2 x daily - 5 x weekly - 2 sets - 10 reps - cues for gentle ROM  Seated Scapular Retraction - 1-2 x daily - 5 x weekly - 2 sets - 10 reps Seated Upper Trapezius Stretch - 2 x daily - 5 x weekly - 2 sets - 10-15 hold Gentle Levator Scapulae Stretch - 2 x daily - 5 x weekly - 2 sets -  10 reps Supine Cervical Retraction with Towel - 2 x daily - 5 x weekly - 1-2 sets - 10 reps -supine on mat table, with use of pillow - when coming up from supine position, pt reported incr dizziness       PT Education - 12/15/20 0940    Education Details initial HEP    Person(s) Educated Patient    Methods Explanation;Demonstration;Handout    Comprehension Verbalized understanding;Returned demonstration            PT Short Term Goals - 12/15/20 0946      PT SHORT TERM GOAL #1   Title Pt will be independent with initial HEP in order to build upon functional gains made in therapy. ALL STGS DUE 01/07/21    Time 4    Period Weeks    Status New    Target Date 01/07/21      PT SHORT TERM GOAL #2   Title Pt will perform NDI to assess disability related to neck pain, with LTG written as appropriate.    Baseline 24% on 12/15/20 = moderate disability.    Time 4    Period Weeks    Status Achieved      PT SHORT TERM GOAL #3   Title Pt will undergo  further vestibular assessment with LTG written as appropriate.    Time 4    Period Weeks    Status New      PT SHORT TERM GOAL #4   Title Pt will improve R cervical rotation to at least 45 deg with a 4/10 pain or less in order to improve ROM for driving    Baseline 40 degrees, 6-7/10 pain when turning neck    Time 4    Period Weeks      PT SHORT TERM GOAL #5   Title Pt will improve cervical sidebending ROM to at least 30 degrees B with reports of 1/10 pain or less when side bending to R.    Baseline 25 degrees B, 2-3/10 pain to R side    Time 4    Period Weeks    Status New             PT Long Term Goals - 12/15/20 0947      PT LONG TERM GOAL #1   Title Pt will be independent with final HEP in order to build upon functional gains made in therapy.    Time 8    Period Weeks    Status New      PT LONG TERM GOAL #2   Title Pt will decr NDI to 24% or less in order to demo decr disability in relation to pt's neck pain.    Baseline 24% on 12/15/20    Time 8    Period Weeks    Status Revised      PT LONG TERM GOAL #3   Title Vestibular goal to be written as appropriate based on assessment.    Time 8    Period Weeks    Status New      PT LONG TERM GOAL #4   Title Pt will improve R cervical rotation AROM to at least 55 deg with a 1/10 pain or less in order to improve ROM for driving    Baseline 40 degrees, 6-7/10 pain    Time 8    Period Weeks    Status New      PT LONG TERM GOAL #5   Title Pt will improve L  cervical rotation AROM  to at least 60 degrees for improved ROM for driving.    Baseline 50 degrees, no pain when performing    Time 8    Period Weeks    Status New                 Plan - 12/15/20 2094    Clinical Impression Statement Pt scored a 24% on the NDI today, indicating moderate disability, LTG written as appropriate. Today's skilled session focused on initiating HEP for gentle cervical ROM and postural strengthening. Pt reporting a slight incr in  headache when performing gentle cervical rotation SNAG with towel that subsided afterwards. Will continue to progress towards LTGs.    Personal Factors and Comorbidities Comorbidity 1;Time since onset of injury/illness/exacerbation;Past/Current Experience    Comorbidities RA (on Enbrel)    Examination-Activity Limitations --   pain interferes with daily activities   Examination-Participation Restrictions Driving   pt still drives, but difficulty turning her head   Stability/Clinical Decision Making Stable/Uncomplicated    Rehab Potential Good    PT Frequency 2x / week    PT Duration 8 weeks    PT Treatment/Interventions ADLs/Self Care Home Management;Canalith Repostioning;Therapeutic exercise;Balance training;Neuromuscular re-education;Therapeutic activities;Patient/family education;Manual techniques;Passive range of motion;Dry needling;Vestibular    PT Next Visit Plan audra-  i have dry needling in the plan potentially for the future, but not for this session. vestibular assessment/screen for BPPV due to reports of dizziness when coming to sit up from supine. how was HEP? try NuStep for warm up, gentle cervical ROM, upper trap/levator stretching and soft tissue work to cervical paraspinals, postural strengthening.    Consulted and Agree with Plan of Care Patient           Patient will benefit from skilled therapeutic intervention in order to improve the following deficits and impairments:  Decreased range of motion,Decreased strength,Hypomobility,Dizziness,Increased muscle spasms,Impaired flexibility,Impaired sensation,Postural dysfunction,Pain  Visit Diagnosis: Cervicalgia  Abnormal posture  Muscle weakness (generalized)     Problem List Patient Active Problem List   Diagnosis Date Noted  . Dysphagia 01/07/2020  . Rectal bleeding 01/07/2020    Arliss Journey, PT, DPT  12/15/2020, 9:52 AM  [facility] [address] [place], [place], [address] Phone: [phone]   Fax:  [phone]  Name: Danielle Curtis MRN: [id number] Date of Birth: December 05, 1960

## 2020-12-15 NOTE — Patient Instructions (Signed)
Access Code: [email or web address] URL: [email or web address] Date: 12/15/2020 Prepared by: Janann August  Exercises Seated Cervical Rotation AROM - 2 x daily - 5 x weekly - 2 sets - 10 reps Seated Scapular Retraction - 1-2 x daily - 5 x weekly - 2 sets - 10 reps Seated Upper Trapezius Stretch - 2 x daily - 5 x weekly - 2 sets - 10-15 hold Gentle Levator Scapulae Stretch - 2 x daily - 5 x weekly - 2 sets - 10 reps Supine Cervical Retraction with Towel - 2 x daily - 5 x weekly - 1-2 sets - 10 reps

## 2020-12-18 ENCOUNTER — Encounter: Payer: Self-pay | Admitting: Physical Therapy

## 2020-12-18 ENCOUNTER — Ambulatory Visit: Payer: BC Managed Care – PPO | Admitting: Physical Therapy

## 2020-12-18 ENCOUNTER — Other Ambulatory Visit: Payer: Self-pay

## 2020-12-18 DIAGNOSIS — M6281 Muscle weakness (generalized): Secondary | ICD-10-CM | POA: Diagnosis not present

## 2020-12-18 DIAGNOSIS — R293 Abnormal posture: Secondary | ICD-10-CM | POA: Diagnosis not present

## 2020-12-18 DIAGNOSIS — M542 Cervicalgia: Secondary | ICD-10-CM

## 2020-12-18 DIAGNOSIS — R42 Dizziness and giddiness: Secondary | ICD-10-CM | POA: Diagnosis not present

## 2020-12-18 NOTE — Therapy (Signed)
Danielle Curtis [address] [place], [place], [address] Phone: [phone]   Fax:  [phone]  Physical Therapy Treatment  Patient Details  Name: Danielle Curtis MRN: [id number] Date of Birth: October 21, 1960 Referring Provider (PT): Dr. Jannifer Franklin   Encounter Date: 12/18/2020   PT End of Session - 12/18/20 2048    Visit Number 3    Number of Visits 17    Date for PT Re-Evaluation 03/09/21    Authorization Type BCBS    PT Start Time 0845    PT Stop Time 0930    PT Time Calculation (min) 45 min    Activity Tolerance Patient tolerated treatment well    Behavior During Therapy [facility] for tasks assessed/performed           Past Medical History:  Diagnosis Date  . High cholesterol   . RA (rheumatoid arthritis) ([place])     Past Surgical History:  Procedure Laterality Date  . ABDOMINAL HYSTERECTOMY     around 1995  . CHOLECYSTECTOMY     around 2010  . COLONOSCOPY  05/21/2013   Dr Orlena Sheldon    There were no vitals filed for this visit.   Subjective Assessment - 12/18/20 0850    Subjective Dizziness is random, not every day but it is quite often - makes her queasy.  Was a little sore after the exercises; "will I build up tolerance for them?".  HA and neck aches always.    Pertinent History RA, chronic neck pain    Diagnostic tests MRI 11/06/20: Unremarkable MRI cervical spine (without). No spinal stenosis or foraminal narrowing.    Patient Stated Goals wants to relieve her pain.    Currently in Pain? Yes                   Vestibular Assessment - 12/18/20 0855      Symptom Behavior   Subjective history of current problem Diagnosed with RA atleast 6 years ago.  Has experienced dizziness for years; unable to recall the first episode.  Has random episodes but frequent.  Can trigger a panic attack.  Reports frontal HA but no migraines or sensitivity to light, sound or smells.  Denies changes in vision.  Denies changes in  hearing or tinnitus.  No vomiting but does feel queasy.    Type of Dizziness  Imbalance    Frequency of Dizziness intermittent but frequent    Duration of Dizziness minutes    Symptom Nature Spontaneous    Aggravating Factors Comment   getting up quickly; closing eyes   Relieving Factors Comments   touching something for support   Progression of Symptoms No change since onset      Oculomotor Exam   Oculomotor Alignment Normal    Ocular ROM WFL    Spontaneous Absent    Gaze-induced  Absent    Smooth Pursuits Saccades    Saccades Hypometric;Comment   extra eye movements vertically     Oculomotor Exam-Fixation Suppressed    Left Head Impulse unable to assess due to RA    Right Head Impulse unable to assess due to RA      Vestibulo-Ocular Reflex   VOR 1 Head Only (x 1 viewing) Reports feeling very dizzy after x1 viewing    VOR to Slow Head Movement Normal    VOR Cancellation Normal      Other Tests   Comments Body on head movement on stool x 5 reps to L and R: pt reported  dizziness after 5 reps of body on head movement      Positional Testing   Sidelying Test Sidelying Right;Sidelying Left    Horizontal Canal Testing Horizontal Canal Right;Horizontal Canal Left      Sidelying Right   Sidelying Right Duration 0    Sidelying Right Symptoms No nystagmus      Sidelying Left   Sidelying Left Duration 0    Sidelying Left Symptoms No nystagmus      Horizontal Canal Right   Horizontal Canal Right Duration 0    Horizontal Canal Right Symptoms Normal      Horizontal Canal Left   Horizontal Canal Left Duration 0    Horizontal Canal Left Symptoms Normal      Balancemaster   Balancemaster Comment MCTSIB: 30 seconds condition one; 30 seconds condition 2 with minimal sway; 30 seconds condition 3; 5 seconds condition 4 with dizziness      Positional Sensitivities   Sit to Supine No dizziness    Supine to Left Side No dizziness    Supine to Right Side No dizziness    Supine to  Sitting Moderate dizziness    Nose to Right Knee No dizziness    Right Knee to Sitting No dizziness    Nose to Left Knee No dizziness    Left Knee to Sitting No dizziness    Head Turning x 5 Mild dizziness    Head Nodding x 5 No dizziness    Pivot Right in Standing No dizziness    Pivot Left in Standing No dizziness    Rolling Right No dizziness    Rolling Left No dizziness                              PT Short Term Goals - 12/18/20 2052      PT SHORT TERM GOAL #1   Title Pt will be independent with initial HEP in order to build upon functional gains made in therapy. ALL STGS DUE 01/07/21    Time 4    Period Weeks    Status New    Target Date 01/07/21      PT SHORT TERM GOAL #2   Title Pt will perform NDI to assess disability related to neck pain, with LTG written as appropriate.    Baseline 24% on 12/15/20 = moderate disability.    Time 4    Period Weeks    Status Achieved      PT SHORT TERM GOAL #3   Title Pt will undergo further vestibular assessment with LTG written as appropriate.    Time 4    Period Weeks    Status Achieved      PT SHORT TERM GOAL #4   Title Pt will improve R cervical rotation to at least 45 deg with a 4/10 pain or less in order to improve ROM for driving    Baseline 40 degrees, 6-7/10 pain when turning neck    Time 4    Period Weeks    Status New      PT SHORT TERM GOAL #5   Title Pt will improve cervical sidebending ROM to at least 30 degrees B with reports of 1/10 pain or less when side bending to R.    Baseline 25 degrees B, 2-3/10 pain to R side    Time 4    Period Weeks    Status New  PT Long Term Goals - 12/18/20 2052      PT LONG TERM GOAL #1   Title Pt will be independent with final HEP in order to build upon functional gains made in therapy.    Time 8    Period Weeks    Status New    Target Date 02/04/21      PT LONG TERM GOAL #2   Title Pt will decr NDI to 24% or less in order to demo decr  disability in relation to pt's neck pain.    Baseline 24% on 12/15/20    Time 8    Period Weeks    Status Revised    Target Date 02/04/21      PT LONG TERM GOAL #3   Title Pt will demonstrate improved sensory integration as indicated by ability to maintain balance on Condition 4 of MCTSIB >/= 15 seconds and will report 1/5 dizziness with supine > sit and repeated head turns    Baseline 5 seconds Condition 4; moderate dizziness 2-3/5 on MSQ for head turns and supine > sit    Time 8    Period Weeks    Status Revised    Target Date 02/04/21      PT LONG TERM GOAL #4   Title Pt will improve R cervical rotation AROM to at least 55 deg with a 1/10 pain or less in order to improve ROM for driving    Baseline 40 degrees, 6-7/10 pain    Time 8    Period Weeks    Status New    Target Date 02/04/21      PT LONG TERM GOAL #5   Title Pt will improve L cervical rotation AROM  to at least 60 degrees for improved ROM for driving.    Baseline 50 degrees, no pain when performing    Time 8    Period Weeks    Status New    Target Date 02/04/21                 Plan - 12/18/20 2042    Clinical Impression Statement Performed vestibular assessment due to pt reporting dizziness with supine > sit last session.  Pt was negative for positional vertigo but did present with motion sensitivity, impaired sensory integration, multiple painful myofascial trigger points in neck/shoulder muscles and cervicogenic dizziness.  Reviewed findings with patient and discussed treatment plan to address including dry needling.  Pt agreeable to plan.  Will add neck proprioception and sensory integration to HEP next session.    Personal Factors and Comorbidities Comorbidity 1;Time since onset of injury/illness/exacerbation;Past/Current Experience    Comorbidities RA (on Enbrel)    Examination-Activity Limitations --   pain interferes with daily activities   Examination-Participation Restrictions Driving   pt still  drives, but difficulty turning her head   Stability/Clinical Decision Making Stable/Uncomplicated    Rehab Potential Good    PT Frequency 2x / week    PT Duration 8 weeks    PT Treatment/Interventions ADLs/Self Care Home Management;Canalith Repostioning;Therapeutic exercise;Balance training;Neuromuscular re-education;Therapeutic activities;Patient/family education;Manual techniques;Passive range of motion;Dry needling;Vestibular    PT Next Visit Plan Dry needling on 5/16.  Add to HEP: corner balance with head turns, balance with EC, x1 viewing seated.  try NuStep for warm up, gentle cervical ROM, upper trap/levator stretching and soft tissue work to cervical paraspinals, postural strengthening.    Consulted and Agree with Plan of Care Patient  Patient will benefit from skilled therapeutic intervention in order to improve the following deficits and impairments:  Decreased range of motion,Decreased strength,Hypomobility,Dizziness,Increased muscle spasms,Impaired flexibility,Impaired sensation,Postural dysfunction,Pain  Visit Diagnosis: Cervicalgia  Dizziness and giddiness     Problem List Patient Active Problem List   Diagnosis Date Noted  . Dysphagia 01/07/2020  . Rectal bleeding 01/07/2020    Rico Junker, PT, DPT 12/18/20    8:55 PM    [facility] [address] [place], [place], [address] Phone: [phone]   Fax:  [phone]  Name: Ruchel Brandenburger MRN: [id number] Date of Birth: 07/17/1961

## 2020-12-22 ENCOUNTER — Encounter: Payer: Self-pay | Admitting: Physical Therapy

## 2020-12-22 ENCOUNTER — Ambulatory Visit: Payer: BC Managed Care – PPO | Admitting: Physical Therapy

## 2020-12-22 ENCOUNTER — Other Ambulatory Visit: Payer: Self-pay

## 2020-12-22 DIAGNOSIS — R293 Abnormal posture: Secondary | ICD-10-CM | POA: Diagnosis not present

## 2020-12-22 DIAGNOSIS — R42 Dizziness and giddiness: Secondary | ICD-10-CM

## 2020-12-22 DIAGNOSIS — M542 Cervicalgia: Secondary | ICD-10-CM

## 2020-12-22 DIAGNOSIS — M6281 Muscle weakness (generalized): Secondary | ICD-10-CM | POA: Diagnosis not present

## 2020-12-22 NOTE — Therapy (Signed)
[facility] [address] [place], [place], [address] Phone: [phone]   Fax:  [phone]  Physical Therapy Treatment  Patient Details  Name: Danielle Curtis MRN: [id number] Date of Birth: 1960-09-14 Referring Provider (PT): Dr. Jannifer Franklin   Encounter Date: 12/22/2020   PT End of Session - 12/22/20 0920    Visit Number 4    Number of Visits 17    Date for PT Re-Evaluation 03/09/21    Authorization Type BCBS    PT Start Time 0803    PT Stop Time 0850    PT Time Calculation (min) 47 min    Activity Tolerance Patient tolerated treatment well    Behavior During Therapy [facility] for tasks assessed/performed           Past Medical History:  Diagnosis Date  . High cholesterol   . RA (rheumatoid arthritis) ([place])     Past Surgical History:  Procedure Laterality Date  . ABDOMINAL HYSTERECTOMY     around 1995  . CHOLECYSTECTOMY     around 2010  . COLONOSCOPY  05/21/2013   Dr Orlena Sheldon    There were no vitals filed for this visit.   Subjective Assessment - 12/22/20 0805    Subjective Feels a bit sore from the exercises. No changes since she was last here.    Pertinent History RA, chronic neck pain    Diagnostic tests MRI 11/06/20: Unremarkable MRI cervical spine (without). No spinal stenosis or foraminal narrowing.    Patient Stated Goals wants to relieve her pain.    Currently in Pain? Yes    Pain Score 3     Pain Location Neck    Pain Orientation Right    Pain Descriptors / Indicators Aching    Pain Type Chronic pain    Aggravating Factors  hurts all the time    Pain Relieving Factors did not take any ibuprofen this morning                             [facility] Adult PT Treatment/Exercise - 12/22/20 0810      Exercises   Exercises Other Exercises    Other Exercises  pt reports feeling incr stress at times and tries to take some deep breaths when she remembers, when pt going to demonstrate pt  performing in standing with incr accessory m. use of breathing. educated on use of diaphragmatic breathing to help calm nervous system down and not overuse accessory muscles for breathing. cues for inhaling through nose for a count of 4 and exhaling through nose for a count of 8 - pt in supine hooklying position. after laying down, pt reporting that she no longer felt like she had a headache, discussed possibly taking about 5 minutes out of her day to practice breathing when feeling more stressed.      Neck Exercises: Machines for Strengthening   Nustep with BUE/BLE for 5 minutes at gear 2 for ROM, circulation, and aerobic warm up      Neck Exercises: Stretches   Upper Trapezius Stretch Right;3 reps;30 seconds    Upper Trapezius Stretch Limitations therapist performing stretch to pt's tolerance, pt supine on mat table, pt reporting relief               Balance Exercises - 12/22/20 0001      Balance Exercises: Standing   Standing Eyes Opened Solid surface;Wide (BOA);Limitations    Standing Eyes Opened Limitations feet  hip width distance, 3 x 5 reps gentle head turns, 3 x 5 reps gentle head nods - pt reporting feeling incr dizziness with head nods but mild sx, rests taken between each rep. added to HEP    Standing Eyes Closed Wide (BOA);Solid surface;3 reps;30 secs;Limitations    Standing Eyes Closed Limitations pt reporting mild symptoms afterwards and feeling more unbalanced, added to HEP. discussed waiting for sx to subside before performing another rep             PT Education - 12/22/20 0918    Education Details diaphragmatic breathing, standing additions to HEP    Person(s) Educated Patient    Methods Explanation;Demonstration;Handout;Verbal cues    Comprehension Verbalized understanding;Returned demonstration            PT Short Term Goals - 12/18/20 2052      PT SHORT TERM GOAL #1   Title Pt will be independent with initial HEP in order to build upon functional gains  made in therapy. ALL STGS DUE 01/07/21    Time 4    Period Weeks    Status New    Target Date 01/07/21      PT SHORT TERM GOAL #2   Title Pt will perform NDI to assess disability related to neck pain, with LTG written as appropriate.    Baseline 24% on 12/15/20 = moderate disability.    Time 4    Period Weeks    Status Achieved      PT SHORT TERM GOAL #3   Title Pt will undergo further vestibular assessment with LTG written as appropriate.    Time 4    Period Weeks    Status Achieved      PT SHORT TERM GOAL #4   Title Pt will improve R cervical rotation to at least 45 deg with a 4/10 pain or less in order to improve ROM for driving    Baseline 40 degrees, 6-7/10 pain when turning neck    Time 4    Period Weeks    Status New      PT SHORT TERM GOAL #5   Title Pt will improve cervical sidebending ROM to at least 30 degrees B with reports of 1/10 pain or less when side bending to R.    Baseline 25 degrees B, 2-3/10 pain to R side    Time 4    Period Weeks    Status New             PT Long Term Goals - 12/18/20 2052      PT LONG TERM GOAL #1   Title Pt will be independent with final HEP in order to build upon functional gains made in therapy.    Time 8    Period Weeks    Status New    Target Date 02/04/21      PT LONG TERM GOAL #2   Title Pt will decr NDI to 24% or less in order to demo decr disability in relation to pt's neck pain.    Baseline 24% on 12/15/20    Time 8    Period Weeks    Status Revised    Target Date 02/04/21      PT LONG TERM GOAL #3   Title Pt will demonstrate improved sensory integration as indicated by ability to maintain balance on Condition 4 of MCTSIB >/= 15 seconds and will report 1/5 dizziness with supine > sit and repeated head turns    Baseline  5 seconds Condition 4; moderate dizziness 2-3/5 on MSQ for head turns and supine > sit    Time 8    Period Weeks    Status Revised    Target Date 02/04/21      PT LONG TERM GOAL #4   Title Pt  will improve R cervical rotation AROM to at least 55 deg with a 1/10 pain or less in order to improve ROM for driving    Baseline 40 degrees, 6-7/10 pain    Time 8    Period Weeks    Status New    Target Date 02/04/21      PT LONG TERM GOAL #5   Title Pt will improve L cervical rotation AROM  to at least 60 degrees for improved ROM for driving.    Baseline 50 degrees, no pain when performing    Time 8    Period Weeks    Status New    Target Date 02/04/21                 Plan - 12/22/20 0929    Clinical Impression Statement Added standing balance with wide BOS with head motions and eyes closed to pt's HEP on level ground. Pt reporting mild sx when performing that would subside with rest. Reported dizziness was worse when looking up>down with head nods. Pt reporting feeling more tired after performing exercises. Will continue to progress towards LTGs.    Personal Factors and Comorbidities Comorbidity 1;Time since onset of injury/illness/exacerbation;Past/Current Experience    Comorbidities RA (on Enbrel)    Examination-Activity Limitations --   pain interferes with daily activities   Examination-Participation Restrictions Driving   pt still drives, but difficulty turning her head   Stability/Clinical Decision Making Stable/Uncomplicated    Rehab Potential Good    PT Frequency 2x / week    PT Duration 8 weeks    PT Treatment/Interventions ADLs/Self Care Home Management;Canalith Repostioning;Therapeutic exercise;Balance training;Neuromuscular re-education;Therapeutic activities;Patient/family education;Manual techniques;Passive range of motion;Dry needling;Vestibular    PT Next Visit Plan Dry needling on 5/16. corner balance with head turns, balance with EC, x1 viewing seated.  try NuStep for warm up, gentle cervical ROM, upper trap/levator stretching and soft tissue work to cervical paraspinals, postural strengthening.    Consulted and Agree with Plan of Care Patient            Patient will benefit from skilled therapeutic intervention in order to improve the following deficits and impairments:  Decreased range of motion,Decreased strength,Hypomobility,Dizziness,Increased muscle spasms,Impaired flexibility,Impaired sensation,Postural dysfunction,Pain  Visit Diagnosis: Cervicalgia  Dizziness and giddiness  Abnormal posture     Problem List Patient Active Problem List   Diagnosis Date Noted  . Dysphagia 01/07/2020  . Rectal bleeding 01/07/2020    Arliss Journey, PT, DPT  12/22/2020, 9:31 AM  [facility] [address] [place], [place], [address] Phone: [phone]   Fax:  [phone]  Name: Danielle Curtis MRN: [id number] Date of Birth: 10-May-1961

## 2020-12-24 ENCOUNTER — Ambulatory Visit: Payer: BC Managed Care – PPO | Admitting: Physical Therapy

## 2020-12-24 ENCOUNTER — Other Ambulatory Visit: Payer: Self-pay

## 2020-12-24 ENCOUNTER — Encounter: Payer: Self-pay | Admitting: Physical Therapy

## 2020-12-24 DIAGNOSIS — R293 Abnormal posture: Secondary | ICD-10-CM | POA: Diagnosis not present

## 2020-12-24 DIAGNOSIS — R42 Dizziness and giddiness: Secondary | ICD-10-CM

## 2020-12-24 DIAGNOSIS — M542 Cervicalgia: Secondary | ICD-10-CM

## 2020-12-24 DIAGNOSIS — M6281 Muscle weakness (generalized): Secondary | ICD-10-CM | POA: Diagnosis not present

## 2020-12-24 NOTE — Therapy (Signed)
[facility] [address] [place], [place], [address] Phone: [phone]   Fax:  [phone]  Physical Therapy Treatment  Patient Details  Name: Danielle Curtis MRN: [id number] Date of Birth: April 16, 1961 Referring Provider (PT): Dr. Jannifer Franklin   Encounter Date: 12/24/2020   PT End of Session - 12/24/20 0900    Visit Number 5    Number of Visits 17    Date for PT Re-Evaluation 03/09/21    Authorization Type BCBS    PT Start Time 0803    PT Stop Time 0845    PT Time Calculation (min) 42 min    Activity Tolerance Patient tolerated treatment well    Behavior During Therapy [facility] for tasks assessed/performed           Past Medical History:  Diagnosis Date  . High cholesterol   . RA (rheumatoid arthritis) ([place])     Past Surgical History:  Procedure Laterality Date  . ABDOMINAL HYSTERECTOMY     around 1995  . CHOLECYSTECTOMY     around 2010  . COLONOSCOPY  05/21/2013   Dr Orlena Sheldon    There were no vitals filed for this visit.   Subjective Assessment - 12/24/20 0805    Subjective Played basketball yesterday - jumped when trying to get the ball and jumping really irrirated her neck. Pain still a 2-3/10 today. Has been trying the diaphragmatic breathing at home and reports it has really been helping. Follows up with Dr. Jannifer Franklin tomorrow.    Pertinent History RA, chronic neck pain    Diagnostic tests MRI 11/06/20: Unremarkable MRI cervical spine (without). No spinal stenosis or foraminal narrowing.    Patient Stated Goals wants to relieve her pain.    Currently in Pain? Yes    Pain Score 3     Pain Location Neck    Pain Orientation Right    Pain Descriptors / Indicators Aching    Pain Type Chronic pain    Aggravating Factors  hurts all the time.    Pain Relieving Factors thinks she might have forgotten to take her ibuprofen this morning.                             [facility] Adult PT Treatment/Exercise  - 12/24/20 0811      Neck Exercises: Machines for Strengthening   Nustep at gear 3 for 6 minutes with BUE/BLE for ROM, circulation, activity tolerance.      Neck Exercises: Supine   Cervical Isometrics Left rotation;Right rotation;5 secs;5 reps;Limitations    Cervical Isometrics Limitations performed B, no pain/headache     Neck Exercises: Stretches   Levator Stretch Right;4 reps;30 seconds    Levator Stretch Limitations pt supine on mat table, performed to pt's tolerance, gently incr ROM each time, pt performing diaphragmatic breathing throughout stretch           Vestibular Treatment/Exercise - 12/24/20 0001      Vestibular Treatment/Exercise   Vestibular Treatment Provided Gaze    Gaze Exercises X1 Viewing Horizontal;X1 Viewing Vertical   Cued for proper technique and ROM.      X1 Viewing Horizontal   Foot Position seated with back support    Reps 30   seconds, x3 reps   Comments mild sx      X1 Viewing Vertical   Foot Position seated with back support    Reps 30   seconds, x3 reps   Comments mild sx  PT Education - 12/24/20 0859    Education Details purpose of VOR x1    Person(s) Educated Patient    Methods Explanation    Comprehension Verbalized understanding            PT Short Term Goals - 12/18/20 2052      PT SHORT TERM GOAL #1   Title Pt will be independent with initial HEP in order to build upon functional gains made in therapy. ALL STGS DUE 01/07/21    Time 4    Period Weeks    Status New    Target Date 01/07/21      PT SHORT TERM GOAL #2   Title Pt will perform NDI to assess disability related to neck pain, with LTG written as appropriate.    Baseline 24% on 12/15/20 = moderate disability.    Time 4    Period Weeks    Status Achieved      PT SHORT TERM GOAL #3   Title Pt will undergo further vestibular assessment with LTG written as appropriate.    Time 4    Period Weeks    Status Achieved      PT SHORT TERM GOAL #4    Title Pt will improve R cervical rotation to at least 45 deg with a 4/10 pain or less in order to improve ROM for driving    Baseline 40 degrees, 6-7/10 pain when turning neck    Time 4    Period Weeks    Status New      PT SHORT TERM GOAL #5   Title Pt will improve cervical sidebending ROM to at least 30 degrees B with reports of 1/10 pain or less when side bending to R.    Baseline 25 degrees B, 2-3/10 pain to R side    Time 4    Period Weeks    Status New             PT Long Term Goals - 12/18/20 2052      PT LONG TERM GOAL #1   Title Pt will be independent with final HEP in order to build upon functional gains made in therapy.    Time 8    Period Weeks    Status New    Target Date 02/04/21      PT LONG TERM GOAL #2   Title Pt will decr NDI to 24% or less in order to demo decr disability in relation to pt's neck pain.    Baseline 24% on 12/15/20    Time 8    Period Weeks    Status Revised    Target Date 02/04/21      PT LONG TERM GOAL #3   Title Pt will demonstrate improved sensory integration as indicated by ability to maintain balance on Condition 4 of MCTSIB >/= 15 seconds and will report 1/5 dizziness with supine > sit and repeated head turns    Baseline 5 seconds Condition 4; moderate dizziness 2-3/5 on MSQ for head turns and supine > sit    Time 8    Period Weeks    Status Revised    Target Date 02/04/21      PT LONG TERM GOAL #4   Title Pt will improve R cervical rotation AROM to at least 55 deg with a 1/10 pain or less in order to improve ROM for driving    Baseline 40 degrees, 6-7/10 pain    Time 8    Period Weeks  Status New    Target Date 02/04/21      PT LONG TERM GOAL #5   Title Pt will improve L cervical rotation AROM  to at least 60 degrees for improved ROM for driving.    Baseline 50 degrees, no pain when performing    Time 8    Period Weeks    Status New    Target Date 02/04/21                 Plan - 12/24/20 0903    Clinical  Impression Statement Initiated seated VOR x1 today in horizontal and vertical directions, pt tolerated well with mild sx. Remainder of session focused on gentle movement/ROM with NuStep, stretching of R levator scap, and gentle supine cervical isometrics. When coming from supine > sit today at end of session, pt reporting incr lightheadedness - may want to assess orthostatics in a future session. Will continue to progress towards goals.    Personal Factors and Comorbidities Comorbidity 1;Time since onset of injury/illness/exacerbation;Past/Current Experience    Comorbidities RA (on Enbrel)    Examination-Activity Limitations --   pain interferes with daily activities   Examination-Participation Restrictions Driving   pt still drives, but difficulty turning her head   Stability/Clinical Decision Making Stable/Uncomplicated    Rehab Potential Good    PT Frequency 2x / week    PT Duration 8 weeks    PT Treatment/Interventions ADLs/Self Care Home Management;Canalith Repostioning;Therapeutic exercise;Balance training;Neuromuscular re-education;Therapeutic activities;Patient/family education;Manual techniques;Passive range of motion;Dry needling;Vestibular    PT Next Visit Plan Dry needling on 5/16. continue and progress corner balance with head turns, balance with EC, x1 viewing seated (add to HEP a appropriate)..  try NuStep for warm up, gentle cervical ROM and isometrics, upper trap/levator stretching, postural strengthening (mid/lower traps). may want to assess orthostatics.    Consulted and Agree with Plan of Care Patient           Patient will benefit from skilled therapeutic intervention in order to improve the following deficits and impairments:  Decreased range of motion,Decreased strength,Hypomobility,Dizziness,Increased muscle spasms,Impaired flexibility,Impaired sensation,Postural dysfunction,Pain  Visit Diagnosis: Cervicalgia  Dizziness and giddiness  Abnormal posture     Problem  List Patient Active Problem List   Diagnosis Date Noted  . Dysphagia 01/07/2020  . Rectal bleeding 01/07/2020    Lillia Pauls, DPT  12/24/2020, 9:05 AM  [facility] [address] [place], [place], [address] Phone: [phone]   Fax:  [phone]  Name: Danielle Curtis MRN: [id number] Date of Birth: 1961-02-01

## 2020-12-25 ENCOUNTER — Encounter: Payer: Self-pay | Admitting: Neurology

## 2020-12-25 ENCOUNTER — Ambulatory Visit: Payer: BC Managed Care – PPO | Admitting: Neurology

## 2020-12-25 DIAGNOSIS — M542 Cervicalgia: Secondary | ICD-10-CM | POA: Diagnosis not present

## 2020-12-25 HISTORY — DX: Cervicalgia: M54.2

## 2020-12-25 NOTE — Progress Notes (Signed)
Reason for visit: Cervicalgia  Miyoko Hashimi is an 60 y.o. female  History of present illness:  Ms. Boline is a 60 year old right-handed white female with a history of chronic neck and shoulder discomfort.  She is on low-dose baclofen taking 10 mg at night, she is on nortriptyline taking 30 mg at night.  She tolerates the medications well.  She is now in physical therapy which is offering good benefit.  She has had MRI of the cervical spine that is anatomically normal.  She is feeling much better than she had, she is clearly making progress with her therapy.  She returns for an evaluation.  Past Medical History:  Diagnosis Date  . High cholesterol   . RA (rheumatoid arthritis) ([place])     Past Surgical History:  Procedure Laterality Date  . ABDOMINAL HYSTERECTOMY     around 1995  . CHOLECYSTECTOMY     around 2010  . COLONOSCOPY  05/21/2013   Dr Orlena Sheldon    Family History  Problem Relation Age of Onset  . Prostate cancer Father   . Breast cancer Sister   . Ovarian cancer Sister        older sister  . Heart disease Other   . Cancer Other   . Colon cancer Neg Hx   . Esophageal cancer Neg Hx     Social history:  reports that she has quit smoking. She has never used smokeless tobacco. She reports that she does not drink alcohol and does not use drugs.    Allergies  Allergen Reactions  . Pregabalin Swelling and Other (See Comments)    MOBILITY PROBLEMS  "nearly killed me"  . Gabapentin Other (See Comments)    Experienced side effects of the medication Mobility issues "nearly killed me"  . Codeine     "makes my skin crawl, makes me very agitated"    Medications:  Prior to Admission medications   Medication Sig Start Date End Date Taking? Authorizing Provider  AMBULATORY NON FORMULARY MEDICATION 2 tablets daily. Dr Schulze's intestinal formula #1   Yes [provider]  baclofen (LIORESAL) 10 MG tablet TAKE 0.5 TABLETS BY MOUTH 2 TIMES DAILY. 12/15/20   Yes Kathrynn Ducking, MD  Etanercept (ENBREL [place]) Inject 50 mg into the skin once a week.   Yes [provider]  ibuprofen (ADVIL) 800 MG tablet Take 800 mg by mouth 3 (three) times daily.   Yes [provider]  MAGNESIUM CITRATE PO Take 1 tablet by mouth daily.   Yes [provider]  nortriptyline (PAMELOR) 10 MG capsule Take 3 capsules at night 10/13/20  Yes Kathrynn Ducking, MD  baclofen (LIORESAL) 20 MG tablet Take 20 mg by mouth. 1 or 2 per day    [provider]  omeprazole (PRILOSEC) 20 MG capsule Take 1 capsule (20 mg total) by mouth daily. 01/07/20   Cirigliano, Vito V, DO  predniSONE (DELTASONE) 5 MG tablet Begin taking 6 tablets daily, taper by one tablet every other day until off the medication. 09/18/20   Kathrynn Ducking, MD    ROS:  Out of a complete 14 system review of symptoms, the patient complains only of the following symptoms, and all other reviewed systems are negative.  Neck pain, shoulder discomfort  Blood pressure 138/90, pulse 88, height 5\' 5"  (1.651 m), weight 178 lb 3.2 oz (80.8 kg).  Physical Exam  General: The patient is alert and cooperative at the time of the examination.  Neuromuscular: The  patient lacks about 15 degrees of full lateral rotation of the cervical spine bilaterally.  Skin: No significant peripheral edema is noted.   Neurologic Exam  Mental status: The patient is alert and oriented x 3 at the time of the examination. The patient has apparent normal recent and remote memory, with an apparently normal attention span and concentration ability.   Cranial nerves: Facial symmetry is present. Speech is normal, no aphasia or dysarthria is noted. Extraocular movements are full. Visual fields are full.  Motor: The patient has good strength in all 4 extremities.  Sensory examination: Soft touch sensation is symmetric on the face, arms, and legs.  Coordination: The patient has good finger-nose-finger and  heel-to-shin bilaterally.  Gait and station: The patient has a normal gait. Tandem gait is normal. Romberg is negative. No drift is seen.  Reflexes: Deep tendon reflexes are symmetric.   MRI cervical 11/06/20:  IMPRESSION:   Unremarkable MRI cervical spine (without). No spinal stenosis or foraminal narrowing.   * MRI scan images were reviewed online. I agree with the written report.   Assessment/Plan:  1.  Cervicalgia  The patient is getting benefit with physical therapy.  She will continue the baclofen and nortriptyline for now.  The patient believes that the nortriptyline helps her anxiety issues as well, she may want to continue this medication long-term.  She will follow-up here in 4 months.  Jill Alexanders MD 12/25/2020 9:43 AM  [facility] [address] [place], [place] [address]  Phone [phone] Fax [phone]

## 2020-12-29 ENCOUNTER — Ambulatory Visit: Payer: BC Managed Care – PPO | Admitting: Physical Therapy

## 2020-12-29 ENCOUNTER — Other Ambulatory Visit: Payer: Self-pay

## 2020-12-29 DIAGNOSIS — M542 Cervicalgia: Secondary | ICD-10-CM

## 2020-12-29 DIAGNOSIS — R293 Abnormal posture: Secondary | ICD-10-CM | POA: Diagnosis not present

## 2020-12-29 DIAGNOSIS — R42 Dizziness and giddiness: Secondary | ICD-10-CM | POA: Diagnosis not present

## 2020-12-29 DIAGNOSIS — M6281 Muscle weakness (generalized): Secondary | ICD-10-CM | POA: Diagnosis not present

## 2020-12-29 NOTE — Therapy (Signed)
[facility] [address] [place], [place], [address] Phone: [phone]   Fax:  [phone]  Physical Therapy Treatment  Patient Details  Name: Danielle Curtis MRN: [id number] Date of Birth: 10/18/1960 Referring Provider (PT): Dr. Jannifer Franklin   Encounter Date: 12/29/2020   PT End of Session - 12/29/20 0808    Visit Number 6    Number of Visits 17    Date for PT Re-Evaluation 03/09/21    Authorization Type BCBS    PT Start Time 0807    PT Stop Time 0850    PT Time Calculation (min) 43 min    Equipment Utilized During Treatment Other (comment)   dry needles   Activity Tolerance Patient tolerated treatment well    Behavior During Therapy [facility] for tasks assessed/performed           Past Medical History:  Diagnosis Date  . Cervicalgia 12/25/2020  . High cholesterol   . RA (rheumatoid arthritis) ([place])     Past Surgical History:  Procedure Laterality Date  . ABDOMINAL HYSTERECTOMY     around 1995  . CHOLECYSTECTOMY     around 2010  . COLONOSCOPY  05/21/2013   Dr Orlena Sheldon    There were no vitals filed for this visit.   Subjective Assessment - 12/29/20 0808    Subjective Neck is feeling better after last session, 1-2/10.  Has not had any dizziness this past week.    Pertinent History RA, chronic neck pain    Diagnostic tests MRI 11/06/20: Unremarkable MRI cervical spine (without). No spinal stenosis or foraminal narrowing.    Patient Stated Goals wants to relieve her pain.    Currently in Pain? Yes                             [facility] Adult PT Treatment/Exercise - 12/29/20 0952      Therapeutic Activites    Therapeutic Activities Other Therapeutic Activities    Other Therapeutic Activities educated pt on purpose and methodology of dry needling.  Screened for any precautions or contraindications: pt is on immune suppressant - PT informed pt of hygiene used and use of only one needle for infection  prevention.  Discussed possible side effects and ways to mitigate.  Pt agreeable to treatment with dry needling      Exercises   Exercises Other Exercises    Other Exercises  Reviewed HEP; discussed posture and purpose of scapular retraction.  Removed cervical retraction and adjusted cervical rotation AROM to include use of hand over forehead guided rotation to decrease over use and compensatory shoulder elevation when rotating to R and to facilitate isolated rotation within pain free ROM.  Updated on HEP.      Manual Therapy   Manual Therapy Soft tissue mobilization    Soft tissue mobilization STM to R upper trap and levator muscles after dry needling to improve muscle and myofascial mobility and blood flow            Trigger Point Dry Needling - 12/29/20 0951    Consent Given? Yes    Education Handout Provided No   reviewed information, will give next session   Muscles Treated Head and Neck Upper trapezius;Levator scapulae    Dry Needling Comments performed in prone on R side only    Upper Trapezius Response Twitch reponse elicited;Palpable increased muscle length    Levator Scapulae Response Twitch response elicited;Palpable increased muscle length  PT Education - 12/29/20 1004    Education Details dry needling    Person(s) Educated Patient    Methods Explanation    Comprehension Verbalized understanding            PT Short Term Goals - 12/18/20 2052      PT SHORT TERM GOAL #1   Title Pt will be independent with initial HEP in order to build upon functional gains made in therapy. ALL STGS DUE 01/07/21    Time 4    Period Weeks    Status New    Target Date 01/07/21      PT SHORT TERM GOAL #2   Title Pt will perform NDI to assess disability related to neck pain, with LTG written as appropriate.    Baseline 24% on 12/15/20 = moderate disability.    Time 4    Period Weeks    Status Achieved      PT SHORT TERM GOAL #3   Title Pt will undergo further  vestibular assessment with LTG written as appropriate.    Time 4    Period Weeks    Status Achieved      PT SHORT TERM GOAL #4   Title Pt will improve R cervical rotation to at least 45 deg with a 4/10 pain or less in order to improve ROM for driving    Baseline 40 degrees, 6-7/10 pain when turning neck    Time 4    Period Weeks    Status New      PT SHORT TERM GOAL #5   Title Pt will improve cervical sidebending ROM to at least 30 degrees B with reports of 1/10 pain or less when side bending to R.    Baseline 25 degrees B, 2-3/10 pain to R side    Time 4    Period Weeks    Status New             PT Long Term Goals - 12/18/20 2052      PT LONG TERM GOAL #1   Title Pt will be independent with final HEP in order to build upon functional gains made in therapy.    Time 8    Period Weeks    Status New    Target Date 02/04/21      PT LONG TERM GOAL #2   Title Pt will decr NDI to 24% or less in order to demo decr disability in relation to pt's neck pain.    Baseline 24% on 12/15/20    Time 8    Period Weeks    Status Revised    Target Date 02/04/21      PT LONG TERM GOAL #3   Title Pt will demonstrate improved sensory integration as indicated by ability to maintain balance on Condition 4 of MCTSIB >/= 15 seconds and will report 1/5 dizziness with supine > sit and repeated head turns    Baseline 5 seconds Condition 4; moderate dizziness 2-3/5 on MSQ for head turns and supine > sit    Time 8    Period Weeks    Status Revised    Target Date 02/04/21      PT LONG TERM GOAL #4   Title Pt will improve R cervical rotation AROM to at least 55 deg with a 1/10 pain or less in order to improve ROM for driving    Baseline 40 degrees, 6-7/10 pain    Time 8    Period Weeks  Status New    Target Date 02/04/21      PT LONG TERM GOAL #5   Title Pt will improve L cervical rotation AROM  to at least 60 degrees for improved ROM for driving.    Baseline 50 degrees, no pain when  performing    Time 8    Period Weeks    Status New    Target Date 02/04/21                 Plan - 12/29/20 0947    Clinical Impression Statement Pt is reporting significant improvement in cervical pain, dizziness and ROM.  Continuing to report increased tension on R side of neck and scapula with painful myofascial trigger points noted in R levator and upper trapezius.  Addressed these trigger points with dry needling, STM and revision of exercises.  Pt reporting improvement in discomfort and tension at end of session.    Personal Factors and Comorbidities Comorbidity 1;Time since onset of injury/illness/exacerbation;Past/Current Experience    Comorbidities RA (on Enbrel)    Examination-Activity Limitations --   pain interferes with daily activities   Examination-Participation Restrictions Driving   pt still drives, but difficulty turning her head   Stability/Clinical Decision Making Stable/Uncomplicated    Rehab Potential Good    PT Frequency 2x / week    PT Duration 8 weeks    PT Treatment/Interventions ADLs/Self Care Home Management;Canalith Repostioning;Therapeutic exercise;Balance training;Neuromuscular re-education;Therapeutic activities;Patient/family education;Manual techniques;Passive range of motion;Dry needling;Vestibular    PT Next Visit Plan How did she feel after dry needling, do again on R side?  Continue and progress corner balance with head turns, balance with EC, x1 viewing seated (add to HEP a appropriate).  gentle cervical ROM and isometrics, upper trap/levator stretching, postural strengthening (mid/lower traps).    Consulted and Agree with Plan of Care Patient           Patient will benefit from skilled therapeutic intervention in order to improve the following deficits and impairments:  Decreased range of motion,Decreased strength,Hypomobility,Dizziness,Increased muscle spasms,Impaired flexibility,Impaired sensation,Postural dysfunction,Pain  Visit  Diagnosis: Cervicalgia  Abnormal posture     Problem List Patient Active Problem List   Diagnosis Date Noted  . Cervicalgia 12/25/2020  . Dysphagia 01/07/2020  . Rectal bleeding 01/07/2020   Rico Junker, PT, DPT 12/29/20    10:08 AM    [facility] [address] [place], [place], [address] Phone: [phone]   Fax:  [phone]  Name: Danielle Curtis MRN: [id number] Date of Birth: [birth year]

## 2020-12-29 NOTE — Patient Instructions (Addendum)
Access Code: [email or web address] URL: [email or web address] Date: 12/29/2020 Prepared by: Misty Stanley  Exercises Seated Cervical Rotation AROM - 2 x daily - 5 x weekly - 2 sets - 10 reps Seated Scapular Retraction - 1-2 x daily - 5 x weekly - 2 sets - 10 reps Seated Upper Trapezius Stretch - 1-2 x daily - 5 x weekly - 2 sets - 10-15 hold Gentle Levator Scapulae Stretch - 1-2 x daily - 5 x weekly - 2 sets - 10 reps Standing Balance with Eyes Closed - 1 x daily - 5 x weekly - 3 sets - 20-30 hold Standing with Head Rotation - 1 x daily - 5 x weekly - 2 sets - 5 reps

## 2021-01-02 ENCOUNTER — Other Ambulatory Visit: Payer: Self-pay

## 2021-01-02 ENCOUNTER — Ambulatory Visit: Payer: BC Managed Care – PPO | Admitting: Physical Therapy

## 2021-01-02 DIAGNOSIS — M542 Cervicalgia: Secondary | ICD-10-CM | POA: Diagnosis not present

## 2021-01-02 DIAGNOSIS — R42 Dizziness and giddiness: Secondary | ICD-10-CM | POA: Diagnosis not present

## 2021-01-02 DIAGNOSIS — R293 Abnormal posture: Secondary | ICD-10-CM | POA: Diagnosis not present

## 2021-01-02 DIAGNOSIS — M6281 Muscle weakness (generalized): Secondary | ICD-10-CM | POA: Diagnosis not present

## 2021-01-02 NOTE — Therapy (Signed)
[facility] [address] [place], [place], [address] Phone: [phone]   Fax:  [phone]  Physical Therapy Treatment  Patient Details  Name: Danielle Curtis MRN: [id number] Date of Birth: 22-Apr-1961 Referring Provider (PT): Dr. Jannifer Franklin   Encounter Date: 01/02/2021   PT End of Session - 01/02/21 0721    Visit Number 7    Number of Visits 17    Date for PT Re-Evaluation 03/09/21    Authorization Type BCBS    PT Start Time 0719    PT Stop Time 0805    PT Time Calculation (min) 46 min    Equipment Utilized During Treatment --    Activity Tolerance Patient tolerated treatment well    Behavior During Therapy [facility] for tasks assessed/performed           Past Medical History:  Diagnosis Date  . Cervicalgia 12/25/2020  . High cholesterol   . RA (rheumatoid arthritis) ([place])     Past Surgical History:  Procedure Laterality Date  . ABDOMINAL HYSTERECTOMY     around 1995  . CHOLECYSTECTOMY     around 2010  . COLONOSCOPY  05/21/2013   Dr Orlena Sheldon    There were no vitals filed for this visit.   Subjective Assessment - 01/02/21 0722    Subjective Pt feels significant relief after dry needling.  "Feels like a thorn has been removed."  No dizziness.  Doesn't feel she will need all her visits.    Pertinent History RA, chronic neck pain    Diagnostic tests MRI 11/06/20: Unremarkable MRI cervical spine (without). No spinal stenosis or foraminal narrowing.    Patient Stated Goals wants to relieve her pain.    Currently in Pain? No/denies              [facility] PT Assessment - 01/02/21 0732      AROM   Overall AROM Comments No dizziness or nausea; slight soreness on R    AROM Assessment Site Cervical    Cervical Flexion 70    Cervical Extension 40    Cervical - Right Side Bend 30    Cervical - Left Side Bend 35    Cervical - Right Rotation 50    Cervical - Left Rotation 55               Vestibular  Assessment - 01/02/21 0737      Positional Sensitivities   Supine to Sitting No dizziness    Head Turning x 5 No dizziness                         Balance Exercises - 01/02/21 0809      Balance Exercises: Standing   Standing Eyes Closed Narrow base of support (BOS);Wide (BOA);Head turns;Foam/compliant surface;Solid surface;Other reps (comment)    Standing Eyes Closed Limitations began with wide BOS on solid surface > narrow BOS > compliant surface with wide BOS.  10 reps head turns with mild symptoms.  NO symptoms with vertical head movements.  Cues for pure cervical rotation; pt tends to extend and laterally flex when rotating to R.             PT Education - 01/02/21 0811    Education Details updated HEP, plan for 2 more visits and then D/C if symptoms stable    Person(s) Educated Patient    Methods Explanation;Demonstration;Handout    Comprehension Verbalized understanding;Returned demonstration  PT Short Term Goals - 01/02/21 0813      PT SHORT TERM GOAL #1   Title Pt will be independent with initial HEP in order to build upon functional gains made in therapy. ALL STGS DUE 01/07/21    Baseline updated HEP on 5/20    Time 4    Period Weeks    Status Achieved    Target Date 01/07/21      PT SHORT TERM GOAL #2   Title Pt will perform NDI to assess disability related to neck pain, with LTG written as appropriate.    Baseline 24% on 12/15/20 = moderate disability.    Time 4    Period Weeks    Status Achieved      PT SHORT TERM GOAL #3   Title Pt will undergo further vestibular assessment with LTG written as appropriate.    Time 4    Period Weeks    Status Achieved      PT SHORT TERM GOAL #4   Title Pt will improve R cervical rotation to at least 45 deg with a 4/10 pain or less in order to improve ROM for driving    Baseline >87 deg, <1/10 pain    Time 4    Period Weeks    Status Achieved      PT SHORT TERM GOAL #5   Title Pt will  improve cervical sidebending ROM to at least 30 degrees B with reports of 1/10 pain or less when side bending to R.    Baseline 30/35 deg    Time 4    Period Weeks    Status Achieved             PT Long Term Goals - 12/18/20 2052      PT LONG TERM GOAL #1   Title Pt will be independent with final HEP in order to build upon functional gains made in therapy.    Time 8    Period Weeks    Status New    Target Date 02/04/21      PT LONG TERM GOAL #2   Title Pt will decr NDI to 24% or less in order to demo decr disability in relation to pt's neck pain.    Baseline 24% on 12/15/20    Time 8    Period Weeks    Status Revised    Target Date 02/04/21      PT LONG TERM GOAL #3   Title Pt will demonstrate improved sensory integration as indicated by ability to maintain balance on Condition 4 of MCTSIB >/= 15 seconds and will report 1/5 dizziness with supine > sit and repeated head turns    Baseline 5 seconds Condition 4; moderate dizziness 2-3/5 on MSQ for head turns and supine > sit    Time 8    Period Weeks    Status Revised    Target Date 02/04/21      PT LONG TERM GOAL #4   Title Pt will improve R cervical rotation AROM to at least 55 deg with a 1/10 pain or less in order to improve ROM for driving    Baseline 40 degrees, 6-7/10 pain    Time 8    Period Weeks    Status New    Target Date 02/04/21      PT LONG TERM GOAL #5   Title Pt will improve L cervical rotation AROM  to at least 60 degrees for improved ROM for driving.  Baseline 50 degrees, no pain when performing    Time 8    Period Weeks    Status New    Target Date 02/04/21                 Plan - 01/02/21 0814    Clinical Impression Statement Pt is making excellent progress and responded very well to dry needling.  Pt has met all STG and is close to meeting all LTG.  Pt demonstrating improvements in pain free, cervical AROM, resolution of dizziness and nausea and decreased motion sensitivity.  Due to  progress updated and progressed standing balance on HEP to include compliant surfaces without use of vision.  Plan to see pt for 2 more visits to address one more area of tension/trigger point on R, to assess LTG and provide pt with final HEP and resources for ongoing wellness.  Pt agreeable to plan.    Personal Factors and Comorbidities Comorbidity 1;Time since onset of injury/illness/exacerbation;Past/Current Experience    Comorbidities RA (on Enbrel)    Examination-Activity Limitations --   pain interferes with daily activities   Examination-Participation Restrictions Driving   pt still drives, but difficulty turning her head   Stability/Clinical Decision Making Stable/Uncomplicated    Rehab Potential Good    PT Frequency 2x / week    PT Duration 8 weeks    PT Treatment/Interventions ADLs/Self Care Home Management;Canalith Repostioning;Therapeutic exercise;Balance training;Neuromuscular re-education;Therapeutic activities;Patient/family education;Manual techniques;Passive range of motion;Dry needling;Vestibular    PT Next Visit Plan Will likely D/C after two more sessions.  I already checked STG.  Reassess NDI, finalize HEP focusing on stress management, stretches, exercises in pool (I will ask Vania Rea and Vinnie Level for suggestions).  I will do one more dry needling session with her on Thursday and likely D/C.    Consulted and Agree with Plan of Care Patient           Patient will benefit from skilled therapeutic intervention in order to improve the following deficits and impairments:  Decreased range of motion,Decreased strength,Hypomobility,Dizziness,Increased muscle spasms,Impaired flexibility,Impaired sensation,Postural dysfunction,Pain  Visit Diagnosis: Cervicalgia  Dizziness and giddiness  Abnormal posture     Problem List Patient Active Problem List   Diagnosis Date Noted  . Cervicalgia 12/25/2020  . Dysphagia 01/07/2020  . Rectal bleeding 01/07/2020    Rico Junker, PT,  DPT 01/02/21    8:19 AM    [facility] [address] [place], [place], [address] Phone: [phone]   Fax:  [phone]  Name: Danielle Curtis MRN: [id number] Date of Birth: November 14, 1960

## 2021-01-02 NOTE — Patient Instructions (Signed)
Access Code: [email or web address] URL: [email or web address] Date: 01/02/2021 Prepared by: Misty Stanley  Exercises Seated Cervical Rotation AROM - 2 x daily - 5 x weekly - 2 sets - 10 reps Seated Scapular Retraction - 1-2 x daily - 5 x weekly - 2 sets - 10 reps Seated Upper Trapezius Stretch - 1-2 x daily - 5 x weekly - 2 sets - 10-15 hold Gentle Levator Scapulae Stretch - 1-2 x daily - 5 x weekly - 2 sets - 10 reps Feet Apart on Pillow-Eyes Closed-Head turns - 1 x daily - 7 x weekly - 2 sets - 10 reps

## 2021-01-05 ENCOUNTER — Ambulatory Visit: Payer: BC Managed Care – PPO | Admitting: Physical Therapy

## 2021-01-05 ENCOUNTER — Other Ambulatory Visit: Payer: Self-pay

## 2021-01-05 ENCOUNTER — Encounter: Payer: Self-pay | Admitting: Physical Therapy

## 2021-01-05 DIAGNOSIS — M542 Cervicalgia: Secondary | ICD-10-CM

## 2021-01-05 DIAGNOSIS — R293 Abnormal posture: Secondary | ICD-10-CM | POA: Diagnosis not present

## 2021-01-05 DIAGNOSIS — M6281 Muscle weakness (generalized): Secondary | ICD-10-CM

## 2021-01-05 DIAGNOSIS — R42 Dizziness and giddiness: Secondary | ICD-10-CM | POA: Diagnosis not present

## 2021-01-05 NOTE — Therapy (Addendum)
[facility] [address] [place], [place], [address] Phone: [phone]   Fax:  [phone]  Physical Therapy Treatment  Patient Details  Name: Danielle Curtis MRN: [id number] Date of Birth: 16-Aug-1961 Referring Provider (PT): Dr. Jannifer Franklin   Encounter Date: 01/05/2021   PT End of Session - 01/05/21 0940    Visit Number 8    Number of Visits 17    Date for PT Re-Evaluation 03/09/21    Authorization Type BCBS    PT Start Time 0847    PT Stop Time 0927    PT Time Calculation (min) 40 min    Activity Tolerance Patient tolerated treatment well    Behavior During Therapy [facility] for tasks assessed/performed           Past Medical History:  Diagnosis Date  . Cervicalgia 12/25/2020  . High cholesterol   . RA (rheumatoid arthritis) ([place])     Past Surgical History:  Procedure Laterality Date  . ABDOMINAL HYSTERECTOMY     around 1995  . CHOLECYSTECTOMY     around 2010  . COLONOSCOPY  05/21/2013   Dr Orlena Sheldon    There were no vitals filed for this visit.   Subjective Assessment - 01/05/21 0848    Subjective Neck feels a lot better since dry needling. Did some swimming over the weekend, didn't really bother the neck over the weekend, just feels like a little twinge. No longer having tingling in her R arm.    Pertinent History RA, chronic neck pain    Diagnostic tests MRI 11/06/20: Unremarkable MRI cervical spine (without). No spinal stenosis or foraminal narrowing.    Patient Stated Goals wants to relieve her pain.    Currently in Pain? --   "just a little twinge, thinks that she might have slept wrong"             [facility] PT Assessment - 01/05/21 0859      Observation/Other Assessments   Other Surveys  Neck Disability Index    Neck Disability Index  6% = no disability               Vestibular Assessment - 01/05/21 0001      Balancemaster   Balancemaster Comment mCTSIB: condition 4- 30 seconds with no  dizziness                    [facility] Adult PT Treatment/Exercise - 01/05/21 0900      Neck Exercises: Machines for Strengthening   Nustep at gear 3 for 5 minutes with BUE/BLE for ROM, circulation, activity tolerance. with pt on NuStep - educated on meaning of NDI percentage compared to previous score               Access Code: [email or web address] URL: [email or web address] Date: 01/05/2021 Prepared by: Janann August  Reviewed pt's HEP for anticipated D/C at next session. Also reviewed diaphragmatic breathing technique for pt to continue to perform daily at home (pt reports this has been very helpful).   Exercises Seated Cervical Rotation AROM - 2 x daily - 5 x weekly - 2 sets - 10 reps - cues to relax shoulders and hold at end range for an additional stretch   Seated Scapular Retraction - 1-2 x daily - 5 x weekly - 2 sets - 10 reps Seated Upper Trapezius Stretch - 1-2 x daily - 5 x weekly - 2 sets - 20-30 hold - cues for proper technique and holding it for longer for  a stretch, cues to gently reach RUE down to floor for more of a stretch  Gentle Levator Scapulae Stretch - 1-2 x daily - 5 x weekly - 3 sets - 20-30 hold - cues for longer holds  Feet Apart on Pillow-Eyes Closed-Head turns - 1 x daily - 5 x weekly - 2 sets - 10 reps - also added in gentle head nods x10 reps - no dizziness.        PT Short Term Goals - 01/02/21 0813      PT SHORT TERM GOAL #1   Title Pt will be independent with initial HEP in order to build upon functional gains made in therapy. ALL STGS DUE 01/07/21    Baseline updated HEP on 5/20    Time 4    Period Weeks    Status Achieved    Target Date 01/07/21      PT SHORT TERM GOAL #2   Title Pt will perform NDI to assess disability related to neck pain, with LTG written as appropriate.    Baseline 24% on 12/15/20 = moderate disability.    Time 4    Period Weeks    Status Achieved      PT SHORT TERM GOAL #3   Title Pt will undergo  further vestibular assessment with LTG written as appropriate.    Time 4    Period Weeks    Status Achieved      PT SHORT TERM GOAL #4   Title Pt will improve R cervical rotation to at least 45 deg with a 4/10 pain or less in order to improve ROM for driving    Baseline >03 deg, <1/10 pain    Time 4    Period Weeks    Status Achieved      PT SHORT TERM GOAL #5   Title Pt will improve cervical sidebending ROM to at least 30 degrees B with reports of 1/10 pain or less when side bending to R.    Baseline 30/35 deg    Time 4    Period Weeks    Status Achieved             PT Long Term Goals - 01/05/21 0859      PT LONG TERM GOAL #1   Title Pt will be independent with final HEP in order to build upon functional gains made in therapy.    Baseline reviewed on 01/05/21 - pt with no issues    Time 8    Period Weeks    Status Achieved      PT LONG TERM GOAL #2   Title Pt will decr NDI to 24% or less in order to demo decr disability in relation to pt's neck pain.    Baseline 24% on 12/15/20; 6% = no disability on 01/05/21    Time 8    Period Weeks    Status Achieved      PT LONG TERM GOAL #3   Title Pt will demonstrate improved sensory integration as indicated by ability to maintain balance on Condition 4 of MCTSIB >/= 15 seconds and will report 1/5 dizziness with supine > sit and repeated head turns    Baseline 30 seconds for condition 4 on 01/05/21, no dizziness with head motions or supine > sit    Time 8    Period Weeks    Status Achieved      PT LONG TERM GOAL #4   Title Pt will improve R cervical rotation AROM  to at least 55 deg with a 1/10 pain or less in order to improve ROM for driving    Baseline 40 degrees, 6-7/10 pain    Time 8    Period Weeks    Status New      PT LONG TERM GOAL #5   Title Pt will improve L cervical rotation AROM  to at least 60 degrees for improved ROM for driving.    Baseline 50 degrees, no pain when performing    Time 8    Period Weeks     Status New                Plan - 01/05/21 1053    Clinical Impression Statement Began to check pt's LTGs. Pt filled out the NDI again today with pt scoring a 6% indicating no disability in regards to neck pain (previously 24% and moderate disability). Pt met LTG #3 - able to perform 30 seconds on condition 4 of mCTSIB with no dizziness and pt not having anymore motion sensitivity. Began to review pt's HEP for anticipated D/C at next session due to progress. Pt in agreement with plan and is very pleased with her progress.    Personal Factors and Comorbidities Comorbidity 1;Time since onset of injury/illness/exacerbation;Past/Current Experience    Comorbidities RA (on Enbrel)    Examination-Activity Limitations --   pain interferes with daily activities   Examination-Participation Restrictions Driving   pt still drives, but difficulty turning her head   Stability/Clinical Decision Making Stable/Uncomplicated    Rehab Potential Good    PT Frequency 2x / week    PT Duration 8 weeks    PT Treatment/Interventions ADLs/Self Care Home Management;Canalith Repostioning;Therapeutic exercise;Balance training;Neuromuscular re-education;Therapeutic activities;Patient/family education;Manual techniques;Passive range of motion;Dry needling;Vestibular    PT Next Visit Plan one more dry needling, any exercises she can do in the pool? likely plan to D/C.    Consulted and Agree with Plan of Care Patient           Patient will benefit from skilled therapeutic intervention in order to improve the following deficits and impairments:  Decreased range of motion,Decreased strength,Hypomobility,Dizziness,Increased muscle spasms,Impaired flexibility,Impaired sensation,Postural dysfunction,Pain  Visit Diagnosis: Cervicalgia  Abnormal posture  Muscle weakness (generalized)     Problem List Patient Active Problem List   Diagnosis Date Noted  . Cervicalgia 12/25/2020  . Dysphagia 01/07/2020  . Rectal  bleeding 01/07/2020    Arliss Journey, PT, DPT  01/05/2021, 10:54 AM  [facility] [address] [place], [place], [address] Phone: [phone]   Fax:  [phone]  Name: Denece Shearer MRN: [id number] Date of Birth: 1961-03-11

## 2021-01-05 NOTE — Patient Instructions (Signed)
Access Code: [email or web address] URL: [email or web address] Date: 01/05/2021 Prepared by: Janann August  Diaphragmatic breathing - performing daily.   Exercises Seated Cervical Rotation AROM - 2 x daily - 5 x weekly - 2 sets - 10 reps Seated Scapular Retraction - 1-2 x daily - 5 x weekly - 2 sets - 10 reps Seated Upper Trapezius Stretch - 1-2 x daily - 5 x weekly - 2 sets - 20-30 hold Gentle Levator Scapulae Stretch - 1-2 x daily - 5 x weekly - 3 sets - 20-30 hold Feet Apart on Pillow-Eyes Closed-Head turns - 1 x daily - 5 x weekly - 2 sets - 10 reps

## 2021-01-08 ENCOUNTER — Other Ambulatory Visit: Payer: Self-pay

## 2021-01-08 ENCOUNTER — Ambulatory Visit: Payer: BC Managed Care – PPO | Admitting: Physical Therapy

## 2021-01-08 DIAGNOSIS — M6281 Muscle weakness (generalized): Secondary | ICD-10-CM | POA: Diagnosis not present

## 2021-01-08 DIAGNOSIS — M542 Cervicalgia: Secondary | ICD-10-CM

## 2021-01-08 DIAGNOSIS — R42 Dizziness and giddiness: Secondary | ICD-10-CM | POA: Diagnosis not present

## 2021-01-08 DIAGNOSIS — R293 Abnormal posture: Secondary | ICD-10-CM | POA: Diagnosis not present

## 2021-01-08 NOTE — Patient Instructions (Signed)
Access Code: [email or web address] URL: [email or web address] Date: 01/08/2021 Prepared by: Misty Stanley  Exercises Uplifting - 1 x daily - 7 x weekly - 3 sets - 10 reps Enclosing - 1 x daily - 7 x weekly - 2 sets - 10 reps Soothing - 1 x daily - 7 x weekly - 2 sets - 10 reps Shifting - 1 x daily - 7 x weekly - 2 sets - 10 reps Accepting - 1 x daily - 7 x weekly - 2 sets - 10 reps Freeing - 1 x daily - 7 x weekly - 2 sets - 10 reps

## 2021-01-08 NOTE — Therapy (Signed)
[facility] [address] [place], [place], [address] Phone: [phone]   Fax:  [phone]  Physical Therapy Treatment and D/C Summary  Patient Details  Name: Danielle Curtis MRN: [id number] Date of Birth: 04/22/1961 Referring Provider (PT): Dr. Jannifer Franklin   Encounter Date: 01/08/2021   PT End of Session - 01/08/21 1553    Visit Number 9    Number of Visits 17    Date for PT Re-Evaluation 03/09/21    Authorization Type BCBS    PT Start Time 0715    PT Stop Time 0740    PT Time Calculation (min) 25 min    Activity Tolerance Patient tolerated treatment well    Behavior During Therapy [facility] for tasks assessed/performed           Past Medical History:  Diagnosis Date  . Cervicalgia 12/25/2020  . High cholesterol   . RA (rheumatoid arthritis) ([place])     Past Surgical History:  Procedure Laterality Date  . ABDOMINAL HYSTERECTOMY     around 1995  . CHOLECYSTECTOMY     around 2010  . COLONOSCOPY  05/21/2013   Dr Orlena Sheldon    There were no vitals filed for this visit.   Subjective Assessment - 01/08/21 1548    Subjective Neck continues to feel good.  Is now able to externally rotated her RUE and place her hand behind her head without difficulty.    Pertinent History RA, chronic neck pain    Diagnostic tests MRI 11/06/20: Unremarkable MRI cervical spine (without). No spinal stenosis or foraminal narrowing.    Patient Stated Goals wants to relieve her pain.    Currently in Pain? No/denies                             [facility] Adult PT Treatment/Exercise - 01/08/21 1549      Therapeutic Activites    Therapeutic Activities Other Therapeutic Activities    Other Therapeutic Activities provided pt with handout of [employer] pool exercises that focus on UE/shoulder relaxation, trunk and neck rotation in slow rhythmic movement.  Educated pt on options for dry needling once D/C (private pay at private clinic,  return to this clinic but would need physician order and functional limitations to address) and discussed indication to return to Neuro rehab - weakness, numbness, etc returns.  Encouraged pt to utilize breathing, stretches, aquatic exercises.            Trigger Point Dry Needling - 01/08/21 1551    Consent Given? Yes    Muscles Treated Head and Neck Upper trapezius    Dry Needling Comments Performed in supine to R side only    Upper Trapezius Response Twitch reponse elicited;Palpable increased muscle length                PT Education - 01/08/21 1552    Education Details see TA; D/C today    Person(s) Educated Patient    Methods Explanation    Comprehension Verbalized understanding          Access Code: [email or web address] URL: [email or web address] Date: 01/08/2021 Prepared by: Misty Stanley  Exercises Uplifting - 1 x daily - 7 x weekly - 3 sets - 10 reps Enclosing - 1 x daily - 7 x weekly - 2 sets - 10 reps Soothing - 1 x daily - 7 x weekly - 2 sets - 10 reps Shifting - 1 x daily - 7 x weekly -  2 sets - 10 reps Accepting - 1 x daily - 7 x weekly - 2 sets - 10 reps Freeing - 1 x daily - 7 x weekly - 2 sets - 10 reps     PT Short Term Goals - 01/02/21 0813      PT SHORT TERM GOAL #1   Title Pt will be independent with initial HEP in order to build upon functional gains made in therapy. ALL STGS DUE 01/07/21    Baseline updated HEP on 5/20    Time 4    Period Weeks    Status Achieved    Target Date 01/07/21      PT SHORT TERM GOAL #2   Title Pt will perform NDI to assess disability related to neck pain, with LTG written as appropriate.    Baseline 24% on 12/15/20 = moderate disability.    Time 4    Period Weeks    Status Achieved      PT SHORT TERM GOAL #3   Title Pt will undergo further vestibular assessment with LTG written as appropriate.    Time 4    Period Weeks    Status Achieved      PT SHORT TERM GOAL #4   Title Pt will improve R cervical  rotation to at least 45 deg with a 4/10 pain or less in order to improve ROM for driving    Baseline >16 deg, <1/10 pain    Time 4    Period Weeks    Status Achieved      PT SHORT TERM GOAL #5   Title Pt will improve cervical sidebending ROM to at least 30 degrees B with reports of 1/10 pain or less when side bending to R.    Baseline 30/35 deg    Time 4    Period Weeks    Status Achieved             PT Long Term Goals - 01/08/21 1553      PT LONG TERM GOAL #1   Title Pt will be independent with final HEP in order to build upon functional gains made in therapy.    Baseline reviewed on 01/05/21 - pt with no issues    Time 8    Period Weeks    Status Achieved      PT LONG TERM GOAL #2   Title Pt will decr NDI to 24% or less in order to demo decr disability in relation to pt's neck pain.    Baseline 24% on 12/15/20; 6% = no disability on 01/05/21    Time 8    Period Weeks    Status Achieved      PT LONG TERM GOAL #3   Title Pt will demonstrate improved sensory integration as indicated by ability to maintain balance on Condition 4 of MCTSIB >/= 15 seconds and will report 1/5 dizziness with supine > sit and repeated head turns    Baseline 30 seconds for condition 4 on 01/05/21, no dizziness with head motions or supine > sit    Time 8    Period Weeks    Status Achieved      PT LONG TERM GOAL #4   Title Pt will improve R cervical rotation AROM to at least 55 deg with a 1/10 pain or less in order to improve ROM for driving    Time 8    Period Weeks    Status Achieved      PT LONG  TERM GOAL #5   Title Pt will improve L cervical rotation AROM  to at least 60 degrees for improved ROM for driving.    Time 8    Period Weeks    Status Achieved                 Plan - 01/08/21 1554    Clinical Impression Statement Pt has made excellent and fast progress and has met all LTG.  Pt is reporting full resolution of dizziness, tingling in RUE, decreased pain and significant  improvement in neck and R shoulder ROM.  Pt is independent with HEP and was also provided with aquatic exercises to perform in order to maintain gains made in therapy.  Pt ready for D/C today.    Personal Factors and Comorbidities Comorbidity 1;Time since onset of injury/illness/exacerbation;Past/Current Experience    Comorbidities RA (on Enbrel)    Examination-Activity Limitations --   pain interferes with daily activities   Examination-Participation Restrictions Driving   pt still drives, but difficulty turning her head   Stability/Clinical Decision Making Stable/Uncomplicated    Rehab Potential Good    PT Frequency 2x / week    PT Duration 8 weeks    PT Treatment/Interventions ADLs/Self Care Home Management;Canalith Repostioning;Therapeutic exercise;Balance training;Neuromuscular re-education;Therapeutic activities;Patient/family education;Manual techniques;Passive range of motion;Dry needling;Vestibular    PT Next Visit Plan one more dry needling, any exercises she can do in the pool? likely plan to D/C.    Consulted and Agree with Plan of Care Patient           Patient will benefit from skilled therapeutic intervention in order to improve the following deficits and impairments:  Decreased range of motion,Decreased strength,Hypomobility,Dizziness,Increased muscle spasms,Impaired flexibility,Impaired sensation,Postural dysfunction,Pain  Visit Diagnosis: Cervicalgia     Problem List Patient Active Problem List   Diagnosis Date Noted  . Cervicalgia 12/25/2020  . Dysphagia 01/07/2020  . Rectal bleeding 01/07/2020    PHYSICAL THERAPY DISCHARGE SUMMARY  Visits from Start of Care: 9  Current functional level related to goals / functional outcomes: See LTG achievement and impression statement above   Remaining deficits: None   Education / Equipment: HEP for land and pool  Plan: Patient agrees to discharge.  Patient goals were met. Patient is being discharged due to meeting  the stated rehab goals.  ?????     Rico Junker, PT, DPT 01/08/21    3:56 PM    [facility] [address] [place], [place], [address] Phone: [phone]   Fax:  [phone]  Name: Soraiya Ahner MRN: [id number] Date of Birth: [birth year]

## 2021-01-14 ENCOUNTER — Ambulatory Visit: Payer: BC Managed Care – PPO | Admitting: Physical Therapy

## 2021-01-16 ENCOUNTER — Encounter: Payer: BC Managed Care – PPO | Admitting: Physical Therapy

## 2021-01-19 ENCOUNTER — Ambulatory Visit: Payer: BC Managed Care – PPO | Admitting: Physical Therapy

## 2021-01-23 ENCOUNTER — Ambulatory Visit: Payer: BC Managed Care – PPO | Admitting: Physical Therapy

## 2021-01-26 ENCOUNTER — Ambulatory Visit: Payer: BC Managed Care – PPO | Admitting: Physical Therapy

## 2021-01-29 ENCOUNTER — Encounter: Payer: BC Managed Care – PPO | Admitting: Physical Therapy

## 2021-02-02 ENCOUNTER — Ambulatory Visit: Payer: BC Managed Care – PPO | Admitting: Physical Therapy

## 2021-02-04 DIAGNOSIS — L821 Other seborrheic keratosis: Secondary | ICD-10-CM | POA: Diagnosis not present

## 2021-02-04 DIAGNOSIS — D225 Melanocytic nevi of trunk: Secondary | ICD-10-CM | POA: Diagnosis not present

## 2021-02-04 DIAGNOSIS — D1801 Hemangioma of skin and subcutaneous tissue: Secondary | ICD-10-CM | POA: Diagnosis not present

## 2021-02-04 DIAGNOSIS — D2239 Melanocytic nevi of other parts of face: Secondary | ICD-10-CM | POA: Diagnosis not present

## 2021-02-05 ENCOUNTER — Encounter: Payer: BC Managed Care – PPO | Admitting: Physical Therapy

## 2021-02-13 DIAGNOSIS — H2513 Age-related nuclear cataract, bilateral: Secondary | ICD-10-CM | POA: Diagnosis not present

## 2021-02-13 DIAGNOSIS — H5203 Hypermetropia, bilateral: Secondary | ICD-10-CM | POA: Diagnosis not present

## 2021-02-13 DIAGNOSIS — H353121 Nonexudative age-related macular degeneration, left eye, early dry stage: Secondary | ICD-10-CM | POA: Diagnosis not present

## 2021-03-16 DIAGNOSIS — Z20822 Contact with and (suspected) exposure to covid-19: Secondary | ICD-10-CM | POA: Diagnosis not present

## 2021-03-16 DIAGNOSIS — U071 COVID-19: Secondary | ICD-10-CM | POA: Diagnosis not present

## 2021-03-20 DIAGNOSIS — U071 COVID-19: Secondary | ICD-10-CM | POA: Diagnosis not present

## 2021-03-20 DIAGNOSIS — M069 Rheumatoid arthritis, unspecified: Secondary | ICD-10-CM | POA: Diagnosis not present

## 2021-03-22 ENCOUNTER — Other Ambulatory Visit: Payer: Self-pay | Admitting: Neurology

## 2021-03-30 DIAGNOSIS — H209 Unspecified iridocyclitis: Secondary | ICD-10-CM | POA: Diagnosis not present

## 2021-03-30 DIAGNOSIS — M0579 Rheumatoid arthritis with rheumatoid factor of multiple sites without organ or systems involvement: Secondary | ICD-10-CM | POA: Diagnosis not present

## 2021-03-30 DIAGNOSIS — R1013 Epigastric pain: Secondary | ICD-10-CM | POA: Diagnosis not present

## 2021-03-30 DIAGNOSIS — M2550 Pain in unspecified joint: Secondary | ICD-10-CM | POA: Diagnosis not present

## 2021-04-13 DIAGNOSIS — Z0000 Encounter for general adult medical examination without abnormal findings: Secondary | ICD-10-CM | POA: Diagnosis not present

## 2021-04-13 DIAGNOSIS — Z6829 Body mass index (BMI) 29.0-29.9, adult: Secondary | ICD-10-CM | POA: Diagnosis not present

## 2021-04-17 DIAGNOSIS — Z1231 Encounter for screening mammogram for malignant neoplasm of breast: Secondary | ICD-10-CM | POA: Diagnosis not present

## 2021-05-20 ENCOUNTER — Other Ambulatory Visit: Payer: Self-pay | Admitting: Neurology

## 2021-05-20 DIAGNOSIS — M542 Cervicalgia: Secondary | ICD-10-CM

## 2021-06-02 ENCOUNTER — Encounter: Payer: Self-pay | Admitting: Adult Health

## 2021-06-02 ENCOUNTER — Ambulatory Visit: Payer: BC Managed Care – PPO | Admitting: Adult Health

## 2021-06-02 ENCOUNTER — Other Ambulatory Visit: Payer: Self-pay

## 2021-06-02 VITALS — BP 139/89 | HR 73 | Ht 65.0 in | Wt 176.0 lb

## 2021-06-02 DIAGNOSIS — M542 Cervicalgia: Secondary | ICD-10-CM | POA: Diagnosis not present

## 2021-06-02 DIAGNOSIS — G2581 Restless legs syndrome: Secondary | ICD-10-CM

## 2021-06-02 MED ORDER — ROPINIROLE HCL 0.25 MG PO TABS: 0.2500 mg | ORAL_TABLET | Freq: Every day | ORAL | 5 refills | Status: DC

## 2021-06-02 NOTE — Patient Instructions (Signed)
Your Plan:  Continue baclofen  Start Requip 0.25 mg at bedtime Blood work today   Thank you for coming to see [place] at [facility]. I hope we have been able to provide you high quality care today.  You may receive a patient satisfaction survey over the next few weeks. We would appreciate your feedback and comments so that we may continue to improve ourselves and the health of our patients.

## 2021-06-02 NOTE — Progress Notes (Signed)
I have read the note, and I agree with the clinical assessment and plan.  Kolleen Ochsner K Karlye Ihrig   

## 2021-06-02 NOTE — Progress Notes (Signed)
PATIENT: Danielle Curtis DOB: 1960-10-19  REASON FOR VISIT: follow up HISTORY FROM: patient PRIMARY NEUROLOGIST: Dr. Jannifer Franklin  HISTORY OF PRESENT ILLNESS: Today 06/02/21:  Danielle Curtis is a 60 year old female with a history of chronic neck and shoulder discomfort.  She returns today for follow-up.  She states that she was able to wean herself off of nortriptyline.  She states that physical therapy offered her significant benefit.  She states that she did have a flareup last week but she had not been doing the exercises.  She continues to take baclofen 1 tablet at bedtime.  Patient reports today that she also has discomfort in her legs primarily at night.  She describes it as the need to move continuously.  She states that if she is moving the discomfort resolves.  She denies pins and needle sensations.  Primarily occurs at bedtime.   HISTORY Danielle Curtis is a 59 year old right-handed white female with a history of chronic neck and shoulder discomfort.  She is on low-dose baclofen taking 10 mg at night, she is on nortriptyline taking 30 mg at night.  She tolerates the medications well.  She is now in physical therapy which is offering good benefit.  She has had MRI of the cervical spine that is anatomically normal.  She is feeling much better than she had, she is clearly making progress with her therapy.  She returns for an evaluation.  REVIEW OF SYSTEMS: Out of a complete 14 system review of symptoms, the patient complains only of the following symptoms, and all other reviewed systems are negative.  ALLERGIES: Allergies  Allergen Reactions   Pregabalin Swelling and Other (See Comments)    MOBILITY PROBLEMS  "nearly killed me"   Gabapentin Other (See Comments)    Experienced side effects of the medication Mobility issues "nearly killed me"   Codeine     "makes my skin crawl, makes me very agitated"    HOME MEDICATIONS: Outpatient Medications Prior to Visit  Medication Sig  Dispense Refill   AMBULATORY NON FORMULARY MEDICATION 2 tablets daily. Dr Marcelene Butte intestinal formula #1     baclofen (LIORESAL) 10 MG tablet TAKE 1/2 TABLET BY MOUTH TWICE A DAY 90 tablet 1   Etanercept (ENBREL [place]) Inject 50 mg into the skin once a week.     ibuprofen (ADVIL) 800 MG tablet Take 800 mg by mouth 3 (three) times daily.     MAGNESIUM CITRATE PO Take 1 tablet by mouth daily.     nortriptyline (PAMELOR) 10 MG capsule TAKE 3 CAPSULES AT NIGHT (Patient not taking: Reported on 06/02/2021) 270 capsule 1   No facility-administered medications prior to visit.    PAST MEDICAL HISTORY: Past Medical History:  Diagnosis Date   Cervicalgia 12/25/2020   High cholesterol    RA (rheumatoid arthritis) ([place])     PAST SURGICAL HISTORY: Past Surgical History:  Procedure Laterality Date   ABDOMINAL HYSTERECTOMY     around [place]     around 2010   COLONOSCOPY  05/21/2013   Dr Orlena Sheldon    FAMILY HISTORY: Family History  Problem Relation Age of Onset   Prostate cancer Father    Breast cancer Sister    Ovarian cancer Sister        older sister   Heart disease Other    Cancer Other    Colon cancer Neg Hx    Esophageal cancer Neg Hx     SOCIAL HISTORY: Social History   Socioeconomic History  Marital status: Married    Spouse name: Not on file   Number of children: 4   Years of education: Not on file   Highest education level: Not on file  Occupational History   Not on file  Tobacco Use   Smoking status: Former   Smokeless tobacco: Never  Vaping Use   Vaping Use: Never used  Substance and Sexual Activity   Alcohol use: No   Drug use: No   Sexual activity: Not on file  Other Topics Concern   Not on file  Social History Narrative   Lives at home with spouse   Right handed   Caffeine: 2-3 cups coffee/day   Social Determinants of Health   Financial Resource Strain: Not on file  Food Insecurity: Not on file  Transportation Needs: Not on file   Physical Activity: Not on file  Stress: Not on file  Social Connections: Not on file  Intimate Partner Violence: Not on file      PHYSICAL EXAM  There were no vitals filed for this visit. There is no height or weight on file to calculate BMI.  Generalized: Well developed, in no acute distress   Neurological examination  Mentation: Alert oriented to time, place, history taking. Follows all commands speech and language fluent Cranial nerve II-XII: Pupils were equal round reactive to light. Extraocular movements were full, visual field were full on confrontational test. Facial sensation and strength were normal. Uvula tongue midline. Head turning and shoulder shrug  were normal and symmetric. Motor: The motor testing reveals 5 over 5 strength of all 4 extremities. Good symmetric motor tone is noted throughout.  Sensory: Sensory testing is intact to soft touch on all 4 extremities. No evidence of extinction is noted.  Coordination: Cerebellar testing reveals good finger-nose-finger and heel-to-shin bilaterally.  Gait and station: Gait is normal. Tandem gait is normal. Toe and heel walking normal. Romberg is negative. No drift is seen.  Reflexes: Deep tendon reflexes are symmetric and normal bilaterally.   DIAGNOSTIC DATA (LABS, IMAGING, TESTING) - I reviewed patient records, labs, notes, testing and imaging myself where available.  Lab Results  Component Value Date   WBC 6.0 01/07/2020   HGB 13.3 01/07/2020   HCT 38.8 01/07/2020   MCV 90.2 01/07/2020   PLT 264.0 01/07/2020      Component Value Date/Time   NA 136 01/07/2020 0948   K 4.1 01/07/2020 0948   CL 104 01/07/2020 0948   CO2 25 01/07/2020 0948   GLUCOSE 98 01/07/2020 0948   BUN 19 01/07/2020 0948   CREATININE 0.82 01/07/2020 0948   CALCIUM 9.5 01/07/2020 0948   PROT 7.7 01/07/2020 0948   ALBUMIN 4.6 01/07/2020 0948   AST 17 01/07/2020 0948   ALT 16 01/07/2020 0948   ALKPHOS 54 01/07/2020 0948   BILITOT 0.6  01/07/2020 0948      ASSESSMENT AND PLAN 60 y.o. year old female  has a past medical history of Cervicalgia (12/25/2020), High cholesterol, and RA (rheumatoid arthritis) ([place]). here with:  Cervicalgia  Continue Baclofen 10 mg at bedtime Continue exercises learned at rehab  2.  Restless leg symptoms  We will check blood work today, iron panel and vitamin B12 We will do a trial on Requip 0.25 mg at bedtime reviewed the medication with the patient provided her with a handout     Ward Givens, MSN, NP-C 06/02/2021, 7:58 AM [facility] [address], [place], [place] [address] [phone]

## 2021-06-03 LAB — IRON,TIBC AND FERRITIN PANEL
Ferritin: 110 ng/mL (ref 15–150)
Iron Saturation: 17 % (ref 15–55)
Iron: 56 ug/dL (ref 27–159)
Total Iron Binding Capacity: 339 ug/dL (ref 250–450)
UIBC: 283 ug/dL (ref 131–425)

## 2021-06-03 LAB — VITAMIN B12: Vitamin B-12: 1801 pg/mL — ABNORMAL HIGH (ref 232–1245)

## 2021-07-12 ENCOUNTER — Other Ambulatory Visit: Payer: Self-pay

## 2021-07-12 ENCOUNTER — Emergency Department (HOSPITAL_COMMUNITY)
Admission: EM | Admit: 2021-07-12 | Discharge: 2021-07-13 | Disposition: A | Discharge status: Home / Self Care | Payer: BC Managed Care – PPO | Attending: Emergency Medicine | Admitting: Emergency Medicine

## 2021-07-12 ENCOUNTER — Emergency Department (HOSPITAL_COMMUNITY): Payer: BC Managed Care – PPO

## 2021-07-12 ENCOUNTER — Encounter (HOSPITAL_COMMUNITY): Payer: Self-pay | Admitting: *Deleted

## 2021-07-12 DIAGNOSIS — R55 Syncope and collapse: Secondary | ICD-10-CM | POA: Insufficient documentation

## 2021-07-12 DIAGNOSIS — R42 Dizziness and giddiness: Secondary | ICD-10-CM | POA: Diagnosis not present

## 2021-07-12 DIAGNOSIS — R110 Nausea: Secondary | ICD-10-CM | POA: Insufficient documentation

## 2021-07-12 DIAGNOSIS — Z5321 Procedure and treatment not carried out due to patient leaving prior to being seen by health care provider: Secondary | ICD-10-CM | POA: Diagnosis not present

## 2021-07-12 DIAGNOSIS — R410 Disorientation, unspecified: Secondary | ICD-10-CM | POA: Diagnosis not present

## 2021-07-12 LAB — URINALYSIS, ROUTINE W REFLEX MICROSCOPIC
Bilirubin Urine: NEGATIVE
Glucose, UA: NEGATIVE mg/dL
Hgb urine dipstick: NEGATIVE
Ketones, ur: NEGATIVE mg/dL
Leukocytes,Ua: NEGATIVE
Nitrite: NEGATIVE
Protein, ur: NEGATIVE mg/dL
Specific Gravity, Urine: 1.009 (ref 1.005–1.030)
pH: 6 (ref 5.0–8.0)

## 2021-07-12 LAB — CBC WITH DIFFERENTIAL/PLATELET
Abs Immature Granulocytes: 0.02 10*3/uL (ref 0.00–0.07)
Basophils Absolute: 0.1 10*3/uL (ref 0.0–0.1)
Basophils Relative: 1 %
Eosinophils Absolute: 0.1 10*3/uL (ref 0.0–0.5)
Eosinophils Relative: 2 %
HCT: 37.8 % (ref 36.0–46.0)
Hemoglobin: 12.8 g/dL (ref 12.0–15.0)
Immature Granulocytes: 0 %
Lymphocytes Relative: 46 %
Lymphs Abs: 3.2 10*3/uL (ref 0.7–4.0)
MCH: 30.5 pg (ref 26.0–34.0)
MCHC: 33.9 g/dL (ref 30.0–36.0)
MCV: 90.2 fL (ref 80.0–100.0)
Monocytes Absolute: 0.5 10*3/uL (ref 0.1–1.0)
Monocytes Relative: 8 %
Neutro Abs: 2.9 10*3/uL (ref 1.7–7.7)
Neutrophils Relative %: 43 %
Platelets: 294 10*3/uL (ref 150–400)
RBC: 4.19 MIL/uL (ref 3.87–5.11)
RDW: 12.7 % (ref 11.5–15.5)
WBC: 6.8 10*3/uL (ref 4.0–10.5)
nRBC: 0 % (ref 0.0–0.2)

## 2021-07-12 LAB — ETHANOL: Alcohol, Ethyl (B): <10 mg/dL

## 2021-07-12 LAB — COMPREHENSIVE METABOLIC PANEL WITH GFR
ALT: 19 U/L (ref 0–44)
AST: 24 U/L (ref 15–41)
Albumin: 4 g/dL (ref 3.5–5.0)
Alkaline Phosphatase: 57 U/L (ref 38–126)
Anion gap: 8 (ref 5–15)
BUN: 13 mg/dL (ref 6–20)
CO2: 26 mmol/L (ref 22–32)
Calcium: 9.4 mg/dL (ref 8.9–10.3)
Chloride: 106 mmol/L (ref 98–111)
Creatinine, Ser: 0.88 mg/dL (ref 0.44–1.00)
GFR, Estimated: >60 mL/min
Glucose, Bld: 110 mg/dL — ABNORMAL HIGH (ref 70–99)
Potassium: 3.9 mmol/L (ref 3.5–5.1)
Sodium: 140 mmol/L (ref 135–145)
Total Bilirubin: 0.6 mg/dL (ref 0.3–1.2)
Total Protein: 7.3 g/dL (ref 6.5–8.1)

## 2021-07-12 LAB — TROPONIN I (HIGH SENSITIVITY): Troponin I (High Sensitivity): 3 ng/L (ref <18)

## 2021-07-12 LAB — LIPASE, BLOOD: Lipase: 39 U/L (ref 11–51)

## 2021-07-12 NOTE — ED Triage Notes (Signed)
The pt c/o dizziness she has a headache both arms numbness and tingling for   1-2 hours  she reports that she is feeling weird  she reports passing out but is still aware of her surroundings c/o being tired

## 2021-07-12 NOTE — ED Provider Notes (Addendum)
Emergency Medicine Provider Triage Evaluation Note  Danielle Curtis , a 60 y.o. female  was evaluated in triage.  Pt complains of near syncope.  States she just returning home from the grocery store, 1 hour PTA.  She is getting waves of lightheadedness, feels like she is going to pass out, gets nauseous.  States she has had "an unreasonable" amount of heartburn and nausea over the last few days.  Has been taking Tums.  She feels generally weak, No unilateral weakness.  No numbness.  No facial droop.  No difficulty with word finding.  No back pain, CP, SOB.  No tremors, seizure-like activity, recent falls or injuries. No neck pain.  Review of Systems  Positive: Near syncope, nausea, lightheadedness Negative: Numbness, weakness  Physical Exam  There were no vitals taken for this visit. Gen:   Awake, no distress   Resp:  Normal effort  MSK:   Moves extremities without difficulty  Neuro:  CN 2-12 grossly intact, no drift, equal strength, intact sensation. No tremors, seizure like activity ABD:  Soft, non tender Other:    Medical Decision Making  Medically screening exam initiated at 7:18 PM.  Appropriate orders placed.  Danielle Curtis was informed that the remainder of the evaluation will be completed by another provider, this initial triage assessment does not replace that evaluation, and the importance of remaining in the ED until their evaluation is complete.  Here for evaluation of near syncopal episodes. She has a nonfocal neuro exam without deficits.  Not a code stroke, does not meet LVO criteria. Will plan on labs and imaging        [name], PA-C 07/12/21 1919    [name], MD 07/12/21 2109

## 2021-07-13 NOTE — ED Notes (Signed)
Pt did not want to stay and left .

## 2021-10-12 ENCOUNTER — Ambulatory Visit: Payer: BLUE CROSS/BLUE SHIELD | Admitting: Adult Health

## 2021-10-12 ENCOUNTER — Other Ambulatory Visit: Payer: Self-pay | Admitting: Adult Health

## 2021-10-15 ENCOUNTER — Other Ambulatory Visit: Payer: Self-pay | Admitting: *Deleted

## 2021-10-15 DIAGNOSIS — M542 Cervicalgia: Secondary | ICD-10-CM

## 2021-10-15 MED ORDER — BACLOFEN 10 MG PO TABS: 10.0000 mg | ORAL_TABLET | Freq: Every day | ORAL | 3 refills | Status: AC

## 2022-04-08 ENCOUNTER — Other Ambulatory Visit: Payer: Self-pay | Admitting: Adult Health

## 2022-07-07 ENCOUNTER — Other Ambulatory Visit: Payer: Self-pay | Admitting: Adult Health

## 2023-05-28 IMAGING — CT CT HEAD W/O CM
3 of 4 series · 15 of 47 positions shown, 18 images · non-contrast
Comparison: None.

CLINICAL DATA: Delirium.

EXAM:
CT HEAD WITHOUT CONTRAST
TECHNIQUE: Contiguous axial images were obtained from the base of the skull
through the vertex without intravenous contrast.

[Series 3: head 2.0 h70h · axial · 0.45mm/px · z∈[-83,+47]mm · 9 of 83 slices shown, 12 images]
[im 9/83  brain]
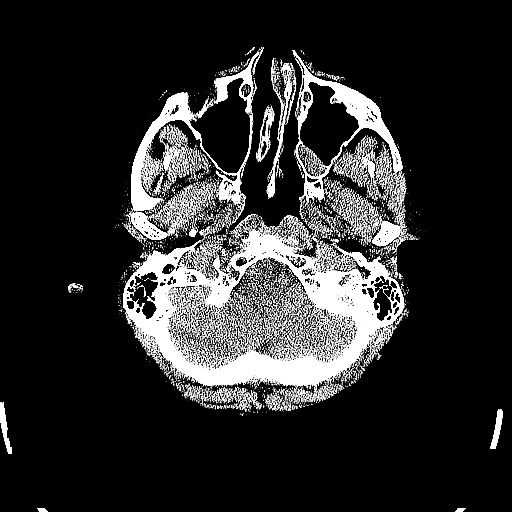
[im 9/83  bone]
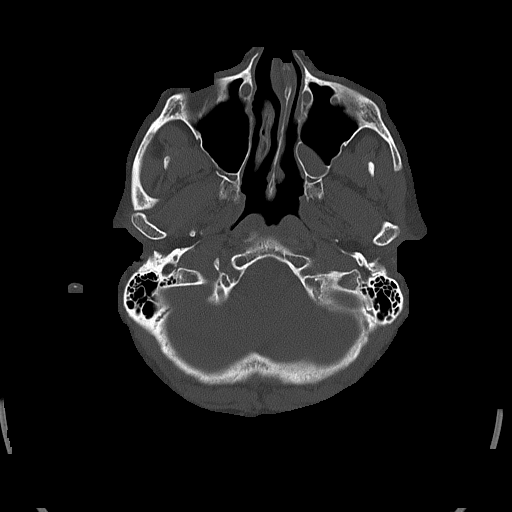
[im 17/83  brain]
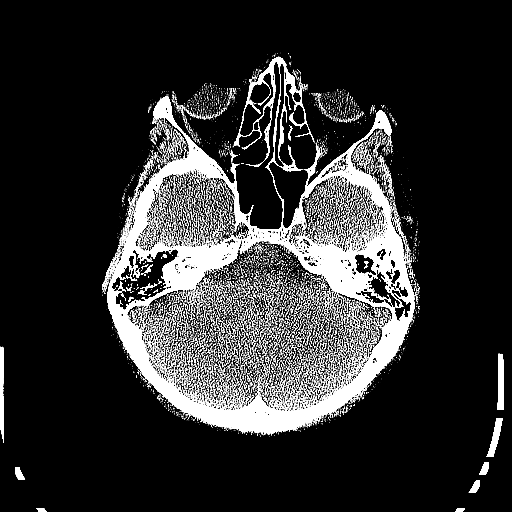
[im 25/83  brain]
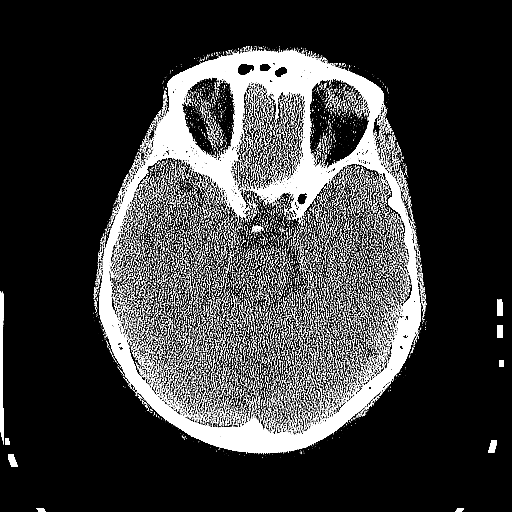
[im 33/83  brain]
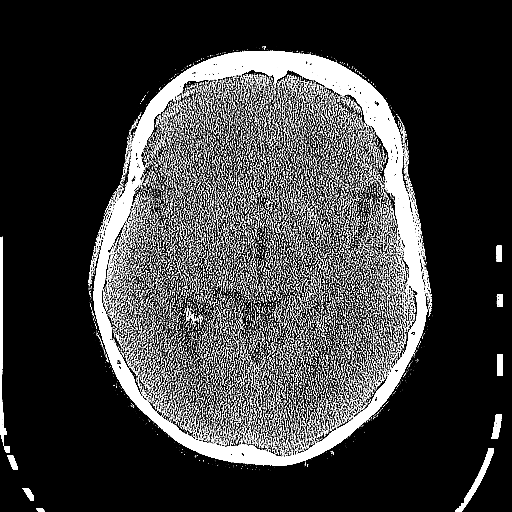
[im 42/83  brain]
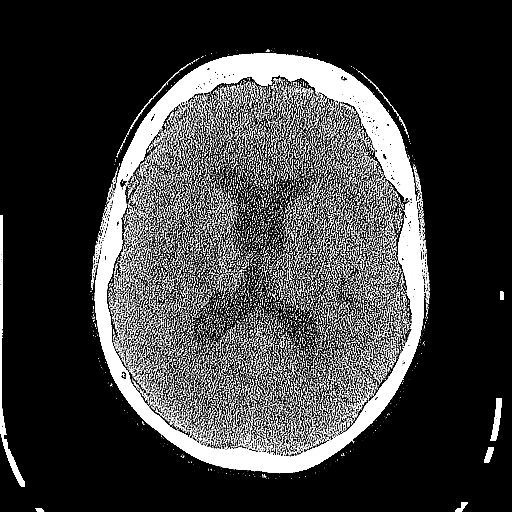
[im 42/83  bone]
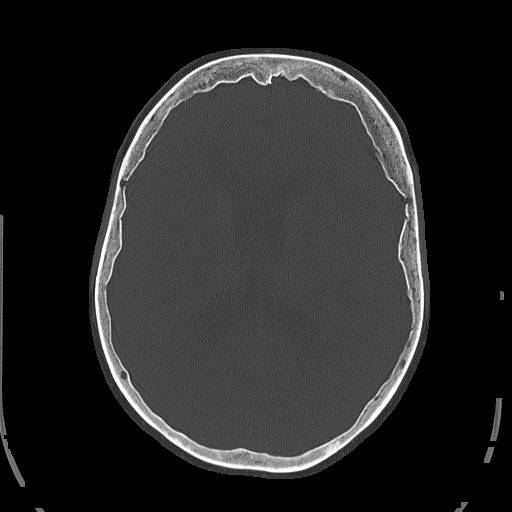
[im 50/83  brain]
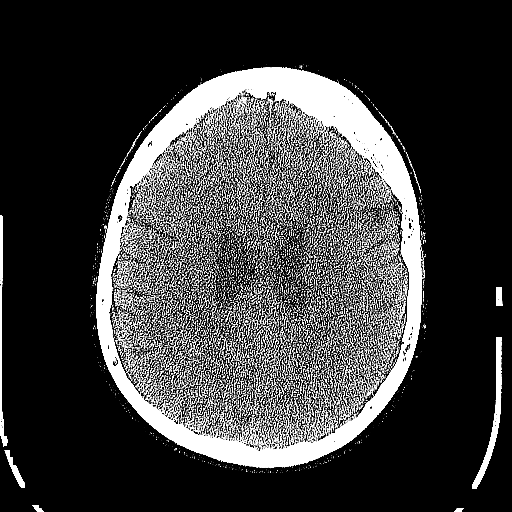
[im 58/83  brain]
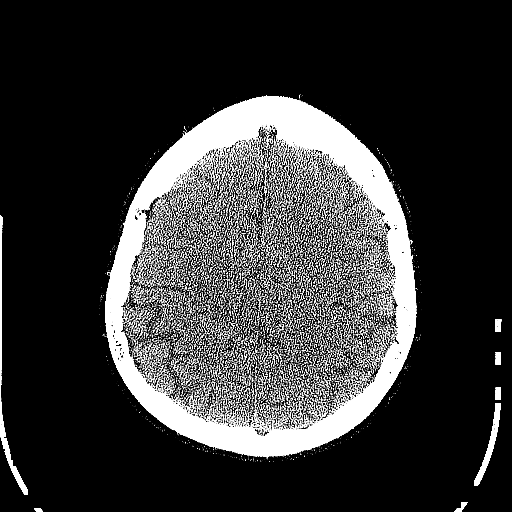
[im 66/83  brain]
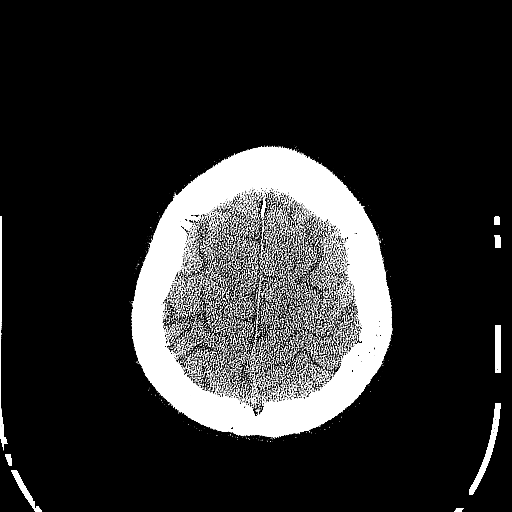
[im 74/83  brain]
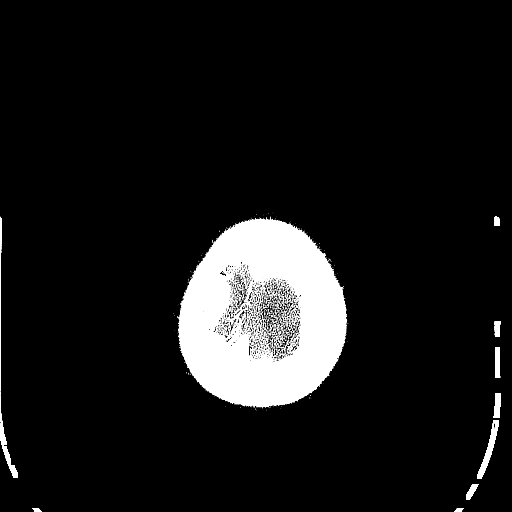
[im 74/83  bone]
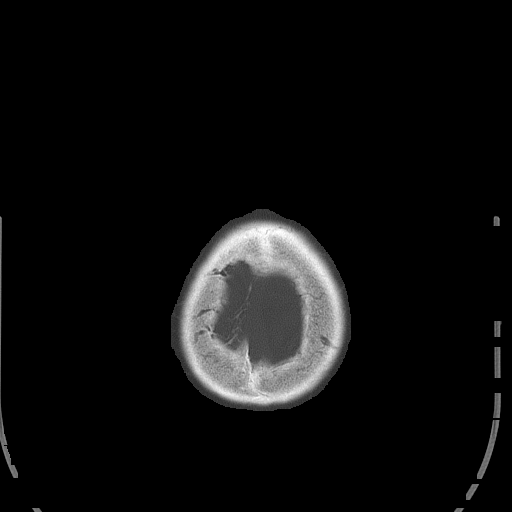

[Series 5: head 3.0 mpr cor · coronal · 0.32mm/px · 3 of 67 slices shown]
[im 23/67  brain]
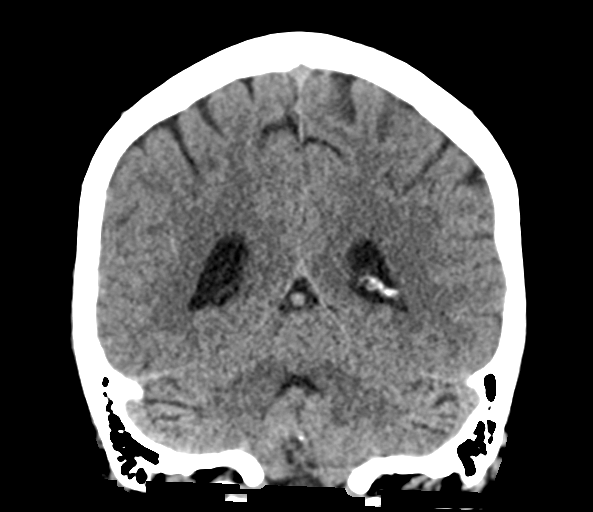
[im 30/67  brain]
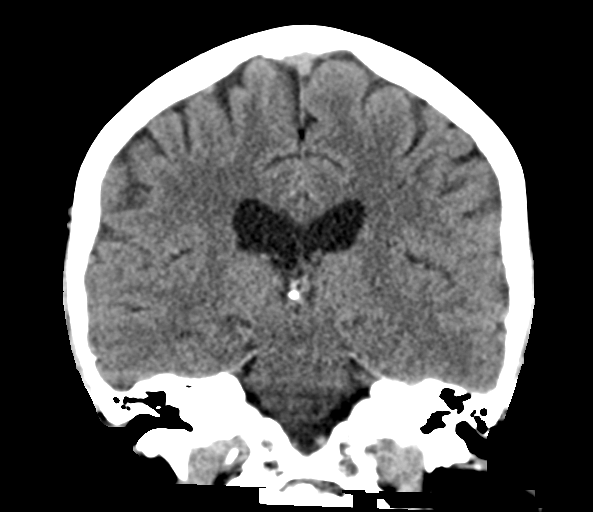
[im 37/67  brain]
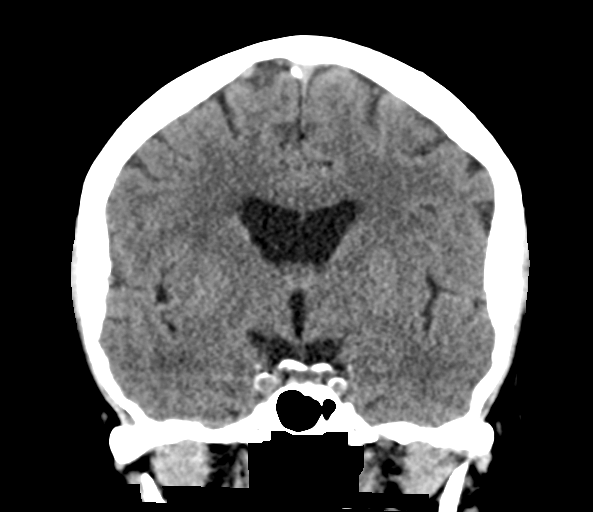

[Series 6: head 3.0 mpr sag · sagittal · 0.32mm/px · 3 of 58 slices shown]
[im 20/58  brain]
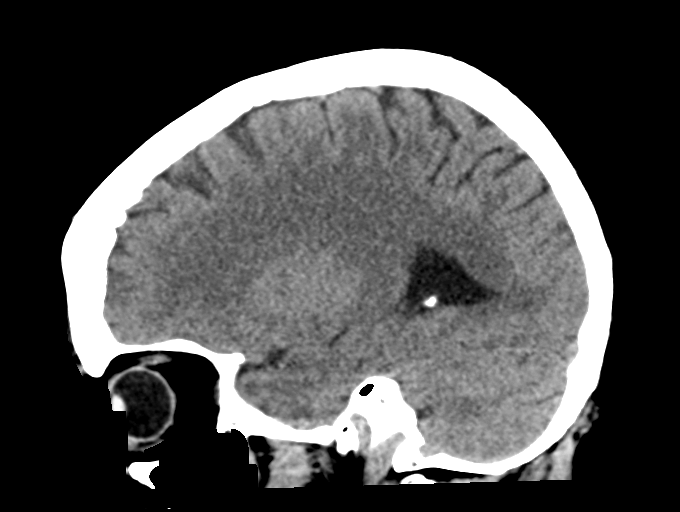
[im 29/58  brain]
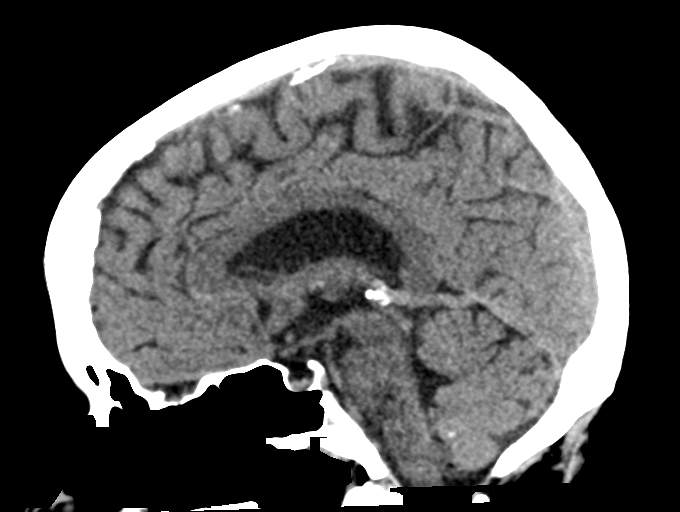
[im 39/58  brain]
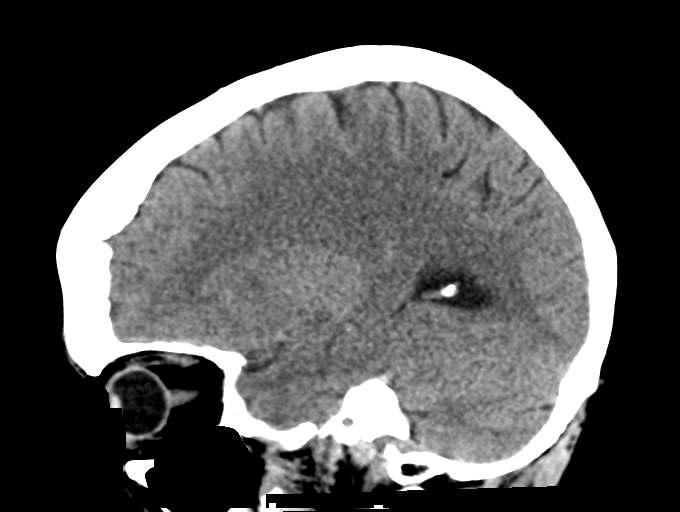

[15 of 47 positions shown; findings below may reference images not displayed]

FINDINGS: Brain: The ventricles and sulci are appropriate size for the
patient's age. The gray-white matter discrimination is preserved.
There is no acute intracranial hemorrhage. No mass effect or midline
shift. No extra-axial fluid collection.

Vascular: No hyperdense vessel or unexpected calcification.

Skull: Normal. Negative for fracture or focal lesion.

Sinuses/Orbits: There is a 2 cm retention cyst or polyp in the left
maxillary sinus. The remainder of the visualized paranasal sinuses
and mastoid air cells are clear.

Other: None
IMPRESSION: Unremarkable noncontrast CT of the brain.
